# Patient Record
Sex: Female | Born: 1980 | Hispanic: No | Marital: Single | Smoking: Former smoker
Health system: Southern US, Community
[De-identification: ages and names within clinical notes are randomized; demographics above are authoritative.]

## PROBLEM LIST (undated history)

## (undated) DIAGNOSIS — O24419 Gestational diabetes mellitus in pregnancy, unspecified control: Secondary | ICD-10-CM

## (undated) DIAGNOSIS — L309 Dermatitis, unspecified: Secondary | ICD-10-CM

## (undated) HISTORY — DX: Gestational diabetes mellitus in pregnancy, unspecified control: O24.419

## (undated) HISTORY — PX: NO PAST SURGERIES: SHX2092

## (undated) HISTORY — DX: Dermatitis, unspecified: L30.9

---

## 2012-11-04 NOTE — L&D Delivery Note (Signed)
Delivery Note At 2:42 AM a viable female was delivered via Vaginal, Spontaneous Delivery (Presentation: Vertex, Right Occiput Anterior).  APGAR: 9, 9; weight pending.   Placenta status: Intact, Spontaneous.  Cord: 3 vessels with the following complications: None.  Cord pH: n/a  Anesthesia: None  Episiotomy: None Lacerations: None Suture Repair: n/a Est. Blood Loss (mL): 300  Mom to postpartum.  Baby to nursery-stable.  Napoleon Form 04/27/2013, 3:01 AM

## 2012-11-27 LAB — OB RESULTS CONSOLE ANTIBODY SCREEN: Antibody Screen: NEGATIVE

## 2012-11-27 LAB — OB RESULTS CONSOLE ABO/RH

## 2012-11-27 LAB — CYSTIC FIBROSIS DIAGNOSTIC STUDY: Interpretation-CFDNA:: NEGATIVE

## 2012-11-27 LAB — OB RESULTS CONSOLE VARICELLA ZOSTER ANTIBODY, IGG: Varicella: IMMUNE

## 2013-01-05 ENCOUNTER — Encounter: Payer: Self-pay | Admitting: *Deleted

## 2013-01-27 ENCOUNTER — Other Ambulatory Visit: Payer: Self-pay

## 2013-01-27 ENCOUNTER — Encounter: Payer: Self-pay | Admitting: Advanced Practice Midwife

## 2013-01-31 ENCOUNTER — Inpatient Hospital Stay (HOSPITAL_COMMUNITY): Admission: AD | Admit: 2013-01-31 | Payer: Self-pay | Source: Ambulatory Visit | Admitting: Obstetrics and Gynecology

## 2013-02-01 ENCOUNTER — Encounter: Payer: Self-pay | Admitting: Obstetrics & Gynecology

## 2013-02-01 ENCOUNTER — Other Ambulatory Visit: Payer: Self-pay

## 2013-02-15 ENCOUNTER — Other Ambulatory Visit: Payer: Medicaid Other

## 2013-02-15 ENCOUNTER — Other Ambulatory Visit: Payer: Self-pay

## 2013-02-15 ENCOUNTER — Ambulatory Visit (INDEPENDENT_AMBULATORY_CARE_PROVIDER_SITE_OTHER): Payer: Medicaid Other | Admitting: Obstetrics & Gynecology

## 2013-02-15 ENCOUNTER — Encounter: Payer: Self-pay | Admitting: Obstetrics & Gynecology

## 2013-02-15 VITALS — BP 120/58 | Wt 183.0 lb

## 2013-02-15 DIAGNOSIS — F1920 Other psychoactive substance dependence, uncomplicated: Secondary | ICD-10-CM

## 2013-02-15 DIAGNOSIS — O0930 Supervision of pregnancy with insufficient antenatal care, unspecified trimester: Secondary | ICD-10-CM

## 2013-02-15 DIAGNOSIS — O99320 Drug use complicating pregnancy, unspecified trimester: Secondary | ICD-10-CM

## 2013-02-15 DIAGNOSIS — O09219 Supervision of pregnancy with history of pre-term labor, unspecified trimester: Secondary | ICD-10-CM

## 2013-02-15 DIAGNOSIS — Z331 Pregnant state, incidental: Secondary | ICD-10-CM

## 2013-02-15 DIAGNOSIS — Z1389 Encounter for screening for other disorder: Secondary | ICD-10-CM

## 2013-02-15 DIAGNOSIS — O09299 Supervision of pregnancy with other poor reproductive or obstetric history, unspecified trimester: Secondary | ICD-10-CM

## 2013-02-15 DIAGNOSIS — Z3480 Encounter for supervision of other normal pregnancy, unspecified trimester: Secondary | ICD-10-CM | POA: Insufficient documentation

## 2013-02-15 DIAGNOSIS — Z8632 Personal history of gestational diabetes: Secondary | ICD-10-CM

## 2013-02-15 DIAGNOSIS — Z3483 Encounter for supervision of other normal pregnancy, third trimester: Secondary | ICD-10-CM

## 2013-02-15 LAB — POCT URINALYSIS DIPSTICK
Blood, UA: NEGATIVE
Glucose, UA: NEGATIVE
Leukocytes, UA: NEGATIVE
Nitrite, UA: NEGATIVE

## 2013-02-15 MED ORDER — FEXOFENADINE-PSEUDOEPHED ER 180-240 MG PO TB24: 1.0000 | ORAL_TABLET | Freq: Every day | ORAL | Status: DC

## 2013-02-15 NOTE — Progress Notes (Signed)
BP weight and urine results all reviewed and noted. Patient reports good fetal movement, denies any bleeding and no rupture of membranes symptoms or regular contractions. Patient is without complaints. All questions were answered. No complaints

## 2013-02-15 NOTE — Patient Instructions (Signed)
Pregnancy - Third Trimester  The third trimester of pregnancy (the last 3 months) is a period of the most rapid growth for you and your baby. The baby approaches a length of 20 inches and a weight of 6 to 10 pounds. The baby is adding on fat and getting ready for life outside your body. While inside, babies have periods of sleeping and waking, suck their thumbs, and hiccups. You can often feel small contractions of the uterus. This is false labor. It is also called Braxton-Hicks contractions. This is like a practice for labor. The usual problems in this stage of pregnancy include more difficulty breathing, swelling of the hands and feet from water retention, and having to urinate more often because of the uterus and baby pressing on your bladder.   PRENATAL EXAMS  · Blood work may continue to be done during prenatal exams. These tests are done to check on your health and the probable health of your baby. Blood work is used to follow your blood levels (hemoglobin). Anemia (low hemoglobin) is common during pregnancy. Iron and vitamins are given to help prevent this. You may also continue to be checked for diabetes. Some of the past blood tests may be done again.  · The size of the uterus is measured during each visit. This makes sure your baby is growing properly according to your pregnancy dates.  · Your blood pressure is checked every prenatal visit. This is to make sure you are not getting toxemia.  · Your urine is checked every prenatal visit for infection, diabetes and protein.  · Your weight is checked at each visit. This is done to make sure gains are happening at the suggested rate and that you and your baby are growing normally.  · Sometimes, an ultrasound is performed to confirm the position and the proper growth and development of the baby. This is a test done that bounces harmless sound waves off the baby so your caregiver can more accurately determine due dates.  · Discuss the type of pain medication and  anesthesia you will have during your labor and delivery.  · Discuss the possibility and anesthesia if a Cesarean Section might be necessary.  · Inform your caregiver if there is any mental or physical violence at home.  Sometimes, a specialized non-stress test, contraction stress test and biophysical profile are done to make sure the baby is not having a problem. Checking the amniotic fluid surrounding the baby is called an amniocentesis. The amniotic fluid is removed by sticking a needle into the belly (abdomen). This is sometimes done near the end of pregnancy if an early delivery is required. In this case, it is done to help make sure the baby's lungs are mature enough for the baby to live outside of the womb. If the lungs are not mature and it is unsafe to deliver the baby, an injection of cortisone medication is given to the mother 1 to 2 days before the delivery. This helps the baby's lungs mature and makes it safer to deliver the baby.  CHANGES OCCURING IN THE THIRD TRIMESTER OF PREGNANCY  Your body goes through many changes during pregnancy. They vary from person to person. Talk to your caregiver about changes you notice and are concerned about.  · During the last trimester, you have probably had an increase in your appetite. It is normal to have cravings for certain foods. This varies from person to person and pregnancy to pregnancy.  · You may begin to   get stretch marks on your hips, abdomen, and breasts. These are normal changes in the body during pregnancy. There are no exercises or medications to take which prevent this change.  · Constipation may be treated with a stool softener or adding bulk to your diet. Drinking lots of fluids, fiber in vegetables, fruits, and whole grains are helpful.  · Exercising is also helpful. If you have been very active up until your pregnancy, most of these activities can be continued during your pregnancy. If you have been less active, it is helpful to start an exercise  program such as walking. Consult your caregiver before starting exercise programs.  · Avoid all smoking, alcohol, un-prescribed drugs, herbs and "street drugs" during your pregnancy. These chemicals affect the formation and growth of the baby. Avoid chemicals throughout the pregnancy to ensure the delivery of a healthy infant.  · Backache, varicose veins and hemorrhoids may develop or get worse.  · You will tire more easily in the third trimester, which is normal.  · The baby's movements may be stronger and more often.  · You may become short of breath easily.  · Your belly button may stick out.  · A yellow discharge may leak from your breasts called colostrum.  · You may have a bloody mucus discharge. This usually occurs a few days to a week before labor begins.  HOME CARE INSTRUCTIONS   · Keep your caregiver's appointments. Follow your caregiver's instructions regarding medication use, exercise, and diet.  · During pregnancy, you are providing food for you and your baby. Continue to eat regular, well-balanced meals. Choose foods such as meat, fish, milk and other low fat dairy products, vegetables, fruits, and whole-grain breads and cereals. Your caregiver will tell you of the ideal weight gain.  · A physical sexual relationship may be continued throughout pregnancy if there are no other problems such as early (premature) leaking of amniotic fluid from the membranes, vaginal bleeding, or belly (abdominal) pain.  · Exercise regularly if there are no restrictions. Check with your caregiver if you are unsure of the safety of your exercises. Greater weight gain will occur in the last 2 trimesters of pregnancy. Exercising helps:  · Control your weight.  · Get you in shape for labor and delivery.  · You lose weight after you deliver.  · Rest a lot with legs elevated, or as needed for leg cramps or low back pain.  · Wear a good support or jogging bra for breast tenderness during pregnancy. This may help if worn during  sleep. Pads or tissues may be used in the bra if you are leaking colostrum.  · Do not use hot tubs, steam rooms, or saunas.  · Wear your seat belt when driving. This protects you and your baby if you are in an accident.  · Avoid raw meat, cat litter boxes and soil used by cats. These carry germs that can cause birth defects in the baby.  · It is easier to loose urine during pregnancy. Tightening up and strengthening the pelvic muscles will help with this problem. You can practice stopping your urination while you are going to the bathroom. These are the same muscles you need to strengthen. It is also the muscles you would use if you were trying to stop from passing gas. You can practice tightening these muscles up 10 times a set and repeating this about 3 times per day. Once you know what muscles to tighten up, do not perform these   exercises during urination. It is more likely to cause an infection by backing up the urine.  · Ask for help if you have financial, counseling or nutritional needs during pregnancy. Your caregiver will be able to offer counseling for these needs as well as refer you for other special needs.  · Make a list of emergency phone numbers and have them available.  · Plan on getting help from family or friends when you go home from the hospital.  · Make a trial run to the hospital.  · Take prenatal classes with the father to understand, practice and ask questions about the labor and delivery.  · Prepare the baby's room/nursery.  · Do not travel out of the city unless it is absolutely necessary and with the advice of your caregiver.  · Wear only low or no heal shoes to have better balance and prevent falling.  MEDICATIONS AND DRUG USE IN PREGNANCY  · Take prenatal vitamins as directed. The vitamin should contain 1 milligram of folic acid. Keep all vitamins out of reach of children. Only a couple vitamins or tablets containing iron may be fatal to a baby or young child when ingested.  · Avoid use  of all medications, including herbs, over-the-counter medications, not prescribed or suggested by your caregiver. Only take over-the-counter or prescription medicines for pain, discomfort, or fever as directed by your caregiver. Do not use aspirin, ibuprofen (Motrin®, Advil®, Nuprin®) or naproxen (Aleve®) unless OK'd by your caregiver.  · Let your caregiver also know about herbs you may be using.  · Alcohol is related to a number of birth defects. This includes fetal alcohol syndrome. All alcohol, in any form, should be avoided completely. Smoking will cause low birth rate and premature babies.  · Street/illegal drugs are very harmful to the baby. They are absolutely forbidden. A baby born to an addicted mother will be addicted at birth. The baby will go through the same withdrawal an adult does.  SEEK MEDICAL CARE IF:  You have any concerns or worries during your pregnancy. It is better to call with your questions if you feel they cannot wait, rather than worry about them.  DECISIONS ABOUT CIRCUMCISION  You may or may not know the sex of your baby. If you know your baby is a boy, it may be time to think about circumcision. Circumcision is the removal of the foreskin of the penis. This is the skin that covers the sensitive end of the penis. There is no proven medical need for this. Often this decision is made on what is popular at the time or based upon religious beliefs and social issues. You can discuss these issues with your caregiver or pediatrician.  SEEK IMMEDIATE MEDICAL CARE IF:   · An unexplained oral temperature above 102° F (38.9° C) develops, or as your caregiver suggests.  · You have leaking of fluid from the vagina (birth canal). If leaking membranes are suspected, take your temperature and tell your caregiver of this when you call.  · There is vaginal spotting, bleeding or passing clots. Tell your caregiver of the amount and how many pads are used.  · You develop a bad smelling vaginal discharge with  a change in the color from clear to white.  · You develop vomiting that lasts more than 24 hours.  · You develop chills or fever.  · You develop shortness of breath.  · You develop burning on urination.  · You loose more than 2 pounds of weight   or gain more than 2 pounds of weight or as suggested by your caregiver.  · You notice sudden swelling of your face, hands, and feet or legs.  · You develop belly (abdominal) pain. Round ligament discomfort is a common non-cancerous (benign) cause of abdominal pain in pregnancy. Your caregiver still must evaluate you.  · You develop a severe headache that does not go away.  · You develop visual problems, blurred or double vision.  · If you have not felt your baby move for more than 1 hour. If you think the baby is not moving as much as usual, eat something with sugar in it and lie down on your left side for an hour. The baby should move at least 4 to 5 times per hour. Call right away if your baby moves less than that.  · You fall, are in a car accident or any kind of trauma.  · There is mental or physical violence at home.  Document Released: 10/15/2001 Document Revised: 01/13/2012 Document Reviewed: 04/19/2009  [employer]® Patient Information ©2013 [employer].

## 2013-02-15 NOTE — Addendum Note (Signed)
Addended by: Lazaro Arms on: 02/15/2013 10:43 AM   Modules accepted: Orders

## 2013-02-16 LAB — CBC
MCH: 31.1 pg (ref 26.0–34.0)
Platelets: 248 10*3/uL (ref 150–400)
RBC: 3.31 MIL/uL — ABNORMAL LOW (ref 3.87–5.11)
WBC: 9.3 10*3/uL (ref 4.0–10.5)

## 2013-02-16 LAB — GLUCOSE TOLERANCE, 2 HOURS W/ 1HR: Glucose, Fasting: 76 mg/dL (ref 70–99)

## 2013-02-16 LAB — RPR

## 2013-02-16 LAB — ANTIBODY SCREEN: Antibody Screen: NEGATIVE

## 2013-03-11 ENCOUNTER — Encounter: Payer: Medicaid Other | Admitting: Advanced Practice Midwife

## 2013-03-17 ENCOUNTER — Other Ambulatory Visit: Payer: Self-pay | Admitting: Obstetrics & Gynecology

## 2013-03-17 ENCOUNTER — Encounter: Payer: Self-pay | Admitting: Advanced Practice Midwife

## 2013-03-17 ENCOUNTER — Ambulatory Visit (INDEPENDENT_AMBULATORY_CARE_PROVIDER_SITE_OTHER): Payer: Medicaid Other | Admitting: Obstetrics & Gynecology

## 2013-03-17 VITALS — BP 120/60 | Wt 189.0 lb

## 2013-03-17 DIAGNOSIS — Z1389 Encounter for screening for other disorder: Secondary | ICD-10-CM

## 2013-03-17 DIAGNOSIS — O09299 Supervision of pregnancy with other poor reproductive or obstetric history, unspecified trimester: Secondary | ICD-10-CM

## 2013-03-17 DIAGNOSIS — Z3483 Encounter for supervision of other normal pregnancy, third trimester: Secondary | ICD-10-CM

## 2013-03-17 DIAGNOSIS — O99320 Drug use complicating pregnancy, unspecified trimester: Secondary | ICD-10-CM

## 2013-03-17 DIAGNOSIS — O09219 Supervision of pregnancy with history of pre-term labor, unspecified trimester: Secondary | ICD-10-CM

## 2013-03-17 DIAGNOSIS — Z331 Pregnant state, incidental: Secondary | ICD-10-CM

## 2013-03-17 LAB — POCT URINALYSIS DIPSTICK
Glucose, UA: NEGATIVE
Nitrite, UA: NEGATIVE

## 2013-03-17 MED ORDER — ACYCLOVIR 400 MG PO TABS: 400.0000 mg | ORAL_TABLET | Freq: Three times a day (TID) | ORAL | Status: DC

## 2013-03-17 NOTE — Progress Notes (Signed)
Some pressure and mucus. 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 Routine questions about pregnancy answered.  F/U in 2 weeks for LROB and GBS (unlikely to go past 41.2 weeks, so culture will be valid).

## 2013-03-17 NOTE — Progress Notes (Signed)
Having a little pressure. Has a mucus discharge.

## 2013-03-17 NOTE — Assessment & Plan Note (Addendum)
Clinic:[facility]  Genetic Screen           Quad Screen/MSAFP:normalk  Anatomic [place] normal  Glucose Screen 76/142/99  GBS   Feeding Preference br/bottle  Contraception ?nexplanon vs Depo  Circumcision At [facility]

## 2013-04-05 ENCOUNTER — Encounter: Payer: Medicaid Other | Admitting: Women's Health

## 2013-04-05 ENCOUNTER — Other Ambulatory Visit: Payer: Medicaid Other

## 2013-04-20 ENCOUNTER — Encounter: Payer: Self-pay | Admitting: Women's Health

## 2013-04-20 ENCOUNTER — Other Ambulatory Visit: Payer: Medicaid Other

## 2013-04-20 ENCOUNTER — Ambulatory Visit (INDEPENDENT_AMBULATORY_CARE_PROVIDER_SITE_OTHER): Payer: Medicaid Other | Admitting: Women's Health

## 2013-04-20 VITALS — BP 162/60 | Wt 193.2 lb

## 2013-04-20 DIAGNOSIS — O99340 Other mental disorders complicating pregnancy, unspecified trimester: Secondary | ICD-10-CM

## 2013-04-20 DIAGNOSIS — R768 Other specified abnormal immunological findings in serum: Secondary | ICD-10-CM | POA: Insufficient documentation

## 2013-04-20 DIAGNOSIS — O0933 Supervision of pregnancy with insufficient antenatal care, third trimester: Secondary | ICD-10-CM

## 2013-04-20 DIAGNOSIS — Z3483 Encounter for supervision of other normal pregnancy, third trimester: Secondary | ICD-10-CM

## 2013-04-20 DIAGNOSIS — O09219 Supervision of pregnancy with history of pre-term labor, unspecified trimester: Secondary | ICD-10-CM

## 2013-04-20 DIAGNOSIS — O99323 Drug use complicating pregnancy, third trimester: Secondary | ICD-10-CM

## 2013-04-20 DIAGNOSIS — Z1389 Encounter for screening for other disorder: Secondary | ICD-10-CM

## 2013-04-20 DIAGNOSIS — Z331 Pregnant state, incidental: Secondary | ICD-10-CM

## 2013-04-20 DIAGNOSIS — O99320 Drug use complicating pregnancy, unspecified trimester: Secondary | ICD-10-CM | POA: Insufficient documentation

## 2013-04-20 DIAGNOSIS — O0930 Supervision of pregnancy with insufficient antenatal care, unspecified trimester: Secondary | ICD-10-CM | POA: Insufficient documentation

## 2013-04-20 NOTE — Progress Notes (Signed)
BP recheck 148/96.  Last appt was 34.2wks- states has been in [place] spending time w/ brother. Reports good fm. Denies regular uc's, lof, vb, urinary frequency, urgency, hesitancy, or dysuria.  DTRs 2+, no edema, no clonus. Offered membrane sweeping, reviewed r/b, pt decided to proceed so membranes swept. Reviewed labor s/s, pre-e s/s, and fetal kick counts.  All questions answered. F/U on Fri for bp recheck and visit.

## 2013-04-20 NOTE — Patient Instructions (Addendum)
Braxton Hicks Contractions Pregnancy is commonly associated with contractions of the uterus throughout the pregnancy. Towards the end of pregnancy (32 to 34 weeks), these contractions [facility]) can develop more often and may become more forceful. This is not true labor because these contractions do not result in opening (dilatation) and thinning of the cervix. They are sometimes difficult to tell apart from true labor because these contractions can be forceful and people have different pain tolerances. You should not feel embarrassed if you go to the hospital with false labor. Sometimes, the only way to tell if you are in true labor is for your caregiver to follow the changes in the cervix. How to tell the difference between true and false labor:  False labor.  The contractions of false labor are usually shorter, irregular and not as hard as those of true labor.  They are often felt in the front of the lower abdomen and in the groin.  They may leave with walking around or changing positions while lying down.  They get weaker and are shorter lasting as time goes on.  These contractions are usually irregular.  They do not usually become progressively stronger, regular and closer together as with true labor.  True labor.  Contractions in true labor last 30 to 70 seconds, become very regular, usually become more intense, and increase in frequency.  They do not go away with walking.  The discomfort is usually felt in the top of the uterus and spreads to the lower abdomen and low back.  True labor can be determined by your caregiver with an exam. This will show that the cervix is dilating and getting thinner. If there are no prenatal problems or other health problems associated with the pregnancy, it is completely safe to be sent home with false labor and await the onset of true labor. HOME CARE INSTRUCTIONS   Keep up with your usual exercises and instructions.  Take medications as  directed.  Keep your regular prenatal appointment.  Eat and drink lightly if you think you are going into labor.  If BH contractions are making you uncomfortable:  Change your activity position from lying down or resting to walking/walking to resting.  Sit and rest in a tub of warm water.  Drink 2 to 3 glasses of water. Dehydration may cause B-H contractions.  Do slow and deep breathing several times an hour. SEEK IMMEDIATE MEDICAL CARE IF:   Your contractions continue to become stronger, more regular, and closer together.  You have a gushing, burst or leaking of fluid from the vagina.  An oral temperature above 102 F (38.9 C) develops.  You have passage of blood-tinged mucus.  You develop vaginal bleeding.  You develop continuous belly (abdominal) pain.  You have low back pain that you never had before.  You feel the baby's head pushing down causing pelvic pressure.  The baby is not moving as much as it used to. Document Released: 10/21/2005 Document Revised: 01/13/2012 Document Reviewed: 04/14/2009 [facility] Patient Information 2014 [place], [place].  Preeclampsia and Eclampsia Preeclampsia is a condition of high blood pressure during pregnancy. It can happen at 20 weeks or later in pregnancy. If high blood pressure occurs in the second half of pregnancy with no other symptoms, it is called gestational hypertension and goes away after the baby is born. If any of the symptoms listed below develop with gestational hypertension, it is then called preeclampsia. Eclampsia (convulsions) may follow preeclampsia. This is one of the reasons for  regular prenatal checkups. Early diagnosis and treatment are very important to prevent eclampsia. CAUSES  There is no known cause of preeclampsia/eclampsia in pregnancy. There are several known conditions that may put the pregnant woman at risk, such as:  The first pregnancy.  Having preeclampsia in a past pregnancy.  Having lasting  (chronic) high blood pressure.  Having multiples (twins, triplets).  Being age 39 or older.  African American ethnic background.  Having kidney disease or diabetes.  Medical conditions such as lupus or blood diseases.  Being overweight (obese). SYMPTOMS   High blood pressure.  Headaches.  Sudden weight gain.  Swelling of hands, face, legs, and feet.  Protein in the urine.  Feeling sick to your stomach (nauseous) and throwing up (vomiting).  Vision problems (blurred or double vision).  Numbness in the face, arms, legs, and feet.  Dizziness.  Slurred speech.  Preeclampsia can cause growth retardation in the fetus.  Separation (abruption) of the placenta.  Not enough fluid in the amniotic sac (oligohydramnios).  Sensitivity to bright lights.  Belly (abdominal) pain. DIAGNOSIS  If protein is found in the urine in the second half of pregnancy, this is considered preeclampsia. Other symptoms mentioned above may also be present. TREATMENT  It is necessary to treat this.  Your caregiver may prescribe bed rest early in this condition. Plenty of rest and salt restriction may be all that is needed.  Medicines may be necessary to lower blood pressure if the condition does not respond to more conservative measures.  In more severe cases, hospitalization may be needed:  For treatment of blood pressure.  To control fluid retention.  To monitor the baby to see if the condition is causing harm to the baby.  Hospitalization is the best way to treat the first sign of preeclampsia. This is so the mother and baby can be watched closely and blood tests can be done effectively and correctly.  If the condition becomes severe, it may be necessary to induce labor or to remove the infant by surgical means (cesarean section). The best cure for preeclampsia/eclampsia is to deliver the baby. Preeclampsia and eclampsia involve risks to mother and infant. Your caregiver will discuss  these risks with you. Together, you can work out the best possible approach to your problems. Make sure you keep your prenatal visits as scheduled. Not keeping appointments could result in a chronic or permanent injury, pain, disability to you, and death or injury to you or your unborn baby. If there is any problem keeping the appointment, you must call to reschedule. HOME CARE INSTRUCTIONS   Keep your prenatal appointments and tests as scheduled.  Tell your caregiver if you have any of the above risk factors.  Get plenty of rest and sleep.  Eat a balanced diet that is low in salt, and do not add salt to your food.  Avoid stressful situations.  Only take over-the-counter and prescriptions medicines for pain, discomfort, or fever as directed by your caregiver. SEEK IMMEDIATE MEDICAL CARE IF:   You develop severe swelling anywhere in the body. This usually occurs in the legs.  You gain 5 lb/2.3 kg or more in a week.  You develop a severe headache, dizziness, problems with your vision, or confusion.  You have abdominal pain, nausea, or vomiting.  You have a seizure.  You have trouble moving any part of your body, or you develop numbness or problems speaking.  You have bruising or abnormal bleeding from anywhere in the body.  You  develop a stiff neck.  You pass out. MAKE SURE YOU:   Understand these instructions.  Will watch your condition.  Will get help right away if you are not doing well or get worse. Document Released: 10/18/2000 Document Revised: 01/13/2012 Document Reviewed: 06/03/2008 [facility] Patient Information 2014 [place], [place].

## 2013-04-21 ENCOUNTER — Encounter (HOSPITAL_COMMUNITY): Payer: Self-pay | Admitting: *Deleted

## 2013-04-21 ENCOUNTER — Inpatient Hospital Stay (HOSPITAL_COMMUNITY)
Admission: AD | Admit: 2013-04-21 | Discharge: 2013-04-21 | Disposition: A | Discharge status: Home / Self Care | Payer: Medicaid Other | Source: Ambulatory Visit | Attending: Family Medicine | Admitting: Family Medicine

## 2013-04-21 DIAGNOSIS — Z3483 Encounter for supervision of other normal pregnancy, third trimester: Secondary | ICD-10-CM

## 2013-04-21 DIAGNOSIS — O0933 Supervision of pregnancy with insufficient antenatal care, third trimester: Secondary | ICD-10-CM

## 2013-04-21 DIAGNOSIS — O99323 Drug use complicating pregnancy, third trimester: Secondary | ICD-10-CM

## 2013-04-21 DIAGNOSIS — O479 False labor, unspecified: Secondary | ICD-10-CM

## 2013-04-21 DIAGNOSIS — O09893 Supervision of other high risk pregnancies, third trimester: Secondary | ICD-10-CM

## 2013-04-21 DIAGNOSIS — O4700 False labor before 37 completed weeks of gestation, unspecified trimester: Secondary | ICD-10-CM

## 2013-04-21 LAB — DRUG SCREEN, URINE, NO CONFIRMATION
Benzodiazepines.: NEGATIVE
Creatinine,U: 232.2 mg/dL
Phencyclidine (PCP): NEGATIVE
Propoxyphene: NEGATIVE

## 2013-04-21 LAB — GC/CHLAMYDIA PROBE AMP
CT Probe RNA: NEGATIVE
GC Probe RNA: NEGATIVE

## 2013-04-21 LAB — OXYCODONE SCREEN, UA, RFLX CONFIRM: Oxycodone Screen, Ur: NEGATIVE ng/mL

## 2013-04-21 NOTE — MAU Provider Note (Signed)
  History     CSN: [id number]  Arrival date and time: 04/21/13 8469  Chief Complaint  Patient presents with  . Labor Eval   HPI  32 y.o. [id number] [redacted]w[redacted]d presents with onset of contractions in the early morning hours.  Patient denies any vaginal bleeding, vaginal discharge, or fluid leakage.  Patient reports good fetal movement.  Patient is accompanied by her husband to the ED.  Patient reports dilation to 4 cm at last office visit.    Past Medical History  Diagnosis Date  . Gestational diabetes     1st pregnancy    Past Surgical History  Procedure Laterality Date  . No past surgeries      Family History  Problem Relation Age of Onset  . Hypertension Other   . Stroke Other   . Cancer Other     prostate, colon, cervical  . Heart attack Other   . Coronary artery disease Other     History  Substance Use Topics  . Smoking status: Former Smoker    Types: Cigarettes  . Smokeless tobacco: Not on file  . Alcohol Use: No    Allergies: No Known Allergies  Prescriptions prior to admission  Medication Sig Dispense Refill  . prenatal vitamin w/FE, FA (PRENATAL 1 + 1) 27-1 MG TABS Take 1 tablet by mouth daily at 12 noon.      Marland Kitchen acyclovir (ZOVIRAX) 400 MG tablet Take 1 tablet (400 mg total) by mouth 3 (three) times daily.  90 tablet  3    Review of Systems  Constitutional: Negative.   Respiratory: Negative.   Cardiovascular: Negative.   Gastrointestinal: Negative.   Genitourinary:       Contractions  Skin: Negative.    Physical Exam   Blood pressure 142/85, pulse 75, temperature 99.2 F (37.3 C), temperature source Oral, resp. rate 20, height 5\' 6"  (1.676 m), weight 87.091 kg (192 lb), last menstrual period 07/20/2012.  Physical Exam  Constitutional: She appears well-developed and well-nourished.  HENT:  Head: Normocephalic and atraumatic.  Cardiovascular: Normal rate, regular rhythm and normal heart sounds.   Respiratory: Effort normal and breath sounds normal.   Genitourinary:  FHT - 125 bpm, moderate variability, + acels, no decels present  UC - None  Cervix - Exam by nursing. 4/50/-3 (posterior position)    MAU Course  Procedures  Patient placed on monitors with reactive FHT and no evidence of maternal contractions.    Assessment and Plan   32 y.o. [id number] [redacted]w[redacted]d presents with onset of contractions in the early morning hours.  Cervical exam unchanged from most recent prenatal visit.  No evidence of contractions on monitor with reactive FHT.  Will discharge home with labor precautions.  Holly Stegall 04/21/2013, 6:01 AM   I have participated in the care of this patient and I agree with the above. Cam Hai 9:42 AM 04/21/2013

## 2013-04-21 NOTE — MAU Note (Signed)
contractions 

## 2013-04-22 NOTE — MAU Provider Note (Signed)
Chart reviewed and agree with management and plan.  

## 2013-04-23 ENCOUNTER — Encounter: Payer: Medicaid Other | Admitting: Obstetrics & Gynecology

## 2013-04-24 ENCOUNTER — Encounter: Payer: Self-pay | Admitting: Women's Health

## 2013-04-26 ENCOUNTER — Encounter (HOSPITAL_COMMUNITY): Payer: Self-pay | Admitting: *Deleted

## 2013-04-26 ENCOUNTER — Inpatient Hospital Stay (HOSPITAL_COMMUNITY)
Admission: AD | Admit: 2013-04-26 | Discharge: 2013-04-28 | DRG: 775 | Disposition: A | Discharge status: Home / Self Care | Payer: Medicaid Other | Source: Ambulatory Visit | Attending: Family Medicine | Admitting: Family Medicine

## 2013-04-26 ENCOUNTER — Encounter: Payer: Self-pay | Admitting: Obstetrics & Gynecology

## 2013-04-26 ENCOUNTER — Ambulatory Visit (INDEPENDENT_AMBULATORY_CARE_PROVIDER_SITE_OTHER): Payer: Medicaid Other | Admitting: Obstetrics & Gynecology

## 2013-04-26 VITALS — BP 160/90 | Wt 192.0 lb

## 2013-04-26 DIAGNOSIS — O99323 Drug use complicating pregnancy, third trimester: Secondary | ICD-10-CM

## 2013-04-26 DIAGNOSIS — Z22330 Carrier of Group B streptococcus: Secondary | ICD-10-CM

## 2013-04-26 DIAGNOSIS — Z331 Pregnant state, incidental: Secondary | ICD-10-CM

## 2013-04-26 DIAGNOSIS — O0933 Supervision of pregnancy with insufficient antenatal care, third trimester: Secondary | ICD-10-CM

## 2013-04-26 DIAGNOSIS — Z1389 Encounter for screening for other disorder: Secondary | ICD-10-CM

## 2013-04-26 DIAGNOSIS — O0930 Supervision of pregnancy with insufficient antenatal care, unspecified trimester: Secondary | ICD-10-CM

## 2013-04-26 DIAGNOSIS — O99892 Other specified diseases and conditions complicating childbirth: Secondary | ICD-10-CM | POA: Diagnosis present

## 2013-04-26 DIAGNOSIS — O139 Gestational [pregnancy-induced] hypertension without significant proteinuria, unspecified trimester: Principal | ICD-10-CM | POA: Diagnosis present

## 2013-04-26 DIAGNOSIS — O09219 Supervision of pregnancy with history of pre-term labor, unspecified trimester: Secondary | ICD-10-CM

## 2013-04-26 DIAGNOSIS — O09893 Supervision of other high risk pregnancies, third trimester: Secondary | ICD-10-CM

## 2013-04-26 DIAGNOSIS — Z3483 Encounter for supervision of other normal pregnancy, third trimester: Secondary | ICD-10-CM

## 2013-04-26 DIAGNOSIS — O99340 Other mental disorders complicating pregnancy, unspecified trimester: Secondary | ICD-10-CM

## 2013-04-26 LAB — CBC
Hemoglobin: 9.4 g/dL — ABNORMAL LOW (ref 12.0–15.0)
MCH: 28.7 pg (ref 26.0–34.0)
MCHC: 33 g/dL (ref 30.0–36.0)
Platelets: 205 10*3/uL (ref 150–400)
RBC: 3.27 MIL/uL — ABNORMAL LOW (ref 3.87–5.11)

## 2013-04-26 LAB — POCT URINALYSIS DIPSTICK
Blood, UA: NEGATIVE
Nitrite, UA: NEGATIVE

## 2013-04-26 LAB — COMPREHENSIVE METABOLIC PANEL
ALT: 11 U/L (ref 0–35)
Alkaline Phosphatase: 140 U/L — ABNORMAL HIGH (ref 39–117)
BUN: 8 mg/dL (ref 6–23)
CO2: 24 mEq/L (ref 19–32)
GFR calc Af Amer: >90 mL/min (ref >90)
GFR calc non Af Amer: >90 mL/min (ref >90)
Glucose, Bld: 90 mg/dL (ref 70–99)
Potassium: 3.9 mEq/L (ref 3.5–5.1)
Sodium: 134 mEq/L — ABNORMAL LOW (ref 135–145)
Total Bilirubin: 0.3 mg/dL (ref 0.3–1.2)
Total Protein: 6 g/dL (ref 6.0–8.3)

## 2013-04-26 LAB — TYPE AND SCREEN
ABO/RH(D): O POS
Antibody Screen: NEGATIVE

## 2013-04-26 LAB — PROTEIN / CREATININE RATIO, URINE: Creatinine, Urine: 32.74 mg/dL

## 2013-04-26 MED ORDER — LACTATED RINGERS IV SOLN
INTRAVENOUS | Status: DC
  Administered 2013-04-26: 21:00:00 via INTRAVENOUS

## 2013-04-26 MED ORDER — NALBUPHINE SYRINGE 5 MG/0.5 ML
10.0000 mg | INJECTION | INTRAMUSCULAR | Status: DC | PRN
  Filled 2013-04-26: qty 1

## 2013-04-26 MED ORDER — TERBUTALINE SULFATE 1 MG/ML IJ SOLN: 0.2500 mg | Freq: Once | INTRAMUSCULAR | Status: AC | PRN

## 2013-04-26 MED ORDER — CITRIC ACID-SODIUM CITRATE 334-500 MG/5ML PO SOLN: 30.0000 mL | ORAL | Status: DC | PRN

## 2013-04-26 MED ORDER — ACYCLOVIR 200 MG PO CAPS
400.0000 mg | ORAL_CAPSULE | Freq: Three times a day (TID) | ORAL | Status: DC
  Administered 2013-04-26: 400 mg via ORAL
  Filled 2013-04-26 (×2): qty 2

## 2013-04-26 MED ORDER — OXYTOCIN 40 UNITS IN LACTATED RINGERS INFUSION - SIMPLE MED
62.5000 mL/h | INTRAVENOUS | Status: DC
  Administered 2013-04-27: 999 mL/h via INTRAVENOUS

## 2013-04-26 MED ORDER — OXYTOCIN BOLUS FROM INFUSION: 500.0000 mL | INTRAVENOUS | Status: DC

## 2013-04-26 MED ORDER — LACTATED RINGERS IV SOLN: 500.0000 mL | INTRAVENOUS | Status: DC | PRN

## 2013-04-26 MED ORDER — PENICILLIN G POTASSIUM 5000000 UNITS IJ SOLR
2.5000 10*6.[IU] | INTRAVENOUS | Status: DC
  Administered 2013-04-27: 2.5 10*6.[IU] via INTRAVENOUS
  Filled 2013-04-26 (×5): qty 2.5

## 2013-04-26 MED ORDER — PENICILLIN G POTASSIUM 5000000 UNITS IJ SOLR
5.0000 10*6.[IU] | Freq: Once | INTRAMUSCULAR | Status: AC
  Administered 2013-04-26: 5 10*6.[IU] via INTRAVENOUS
  Filled 2013-04-26: qty 5

## 2013-04-26 MED ORDER — OXYCODONE-ACETAMINOPHEN 5-325 MG PO TABS
1.0000 | ORAL_TABLET | ORAL | Status: DC | PRN
  Administered 2013-04-27: 1 via ORAL
  Filled 2013-04-26: qty 1

## 2013-04-26 MED ORDER — ACETAMINOPHEN 325 MG PO TABS: 650.0000 mg | ORAL_TABLET | ORAL | Status: DC | PRN

## 2013-04-26 MED ORDER — OXYTOCIN 40 UNITS IN LACTATED RINGERS INFUSION - SIMPLE MED
1.0000 m[IU]/min | INTRAVENOUS | Status: DC
  Administered 2013-04-26: 2 m[IU]/min via INTRAVENOUS
  Filled 2013-04-26: qty 1000

## 2013-04-26 MED ORDER — LIDOCAINE HCL (PF) 1 % IJ SOLN
30.0000 mL | INTRAMUSCULAR | Status: DC | PRN
  Filled 2013-04-26 (×2): qty 30

## 2013-04-26 MED ORDER — ONDANSETRON HCL 4 MG/2ML IJ SOLN: 4.0000 mg | Freq: Four times a day (QID) | INTRAMUSCULAR | Status: DC | PRN

## 2013-04-26 MED ORDER — ACYCLOVIR 400 MG PO TABS
400.0000 mg | ORAL_TABLET | Freq: Three times a day (TID) | ORAL | Status: DC
  Filled 2013-04-26 (×2): qty 1

## 2013-04-26 MED ORDER — IBUPROFEN 600 MG PO TABS: 600.0000 mg | ORAL_TABLET | Freq: Four times a day (QID) | ORAL | Status: DC | PRN

## 2013-04-26 NOTE — H&P (Signed)
Amanda Mccoy is a 32 y.o. female presenting for IOL for Gestational Hypertension.   Maternal Medical History:  Fetal activity: Perceived fetal activity is normal.   Last perceived fetal movement was within the past hour.    Prenatal complications: PIH.   Prenatal Complications - Diabetes: none.     32 y.o. [id number] at [redacted]w[redacted]d with 5 prior SVDs receiving care at [facility]. Had BP 162/60 on 6/17 and 160/90 today in clinic. No headache, RUQ pain or vision changes, no proteinuria previously. Normal genetic screen and 2 hour GTT. No prior pregnancy problems, no hx HTN or preeclampsia, no diabetes. GBS positive.   Having occasional mild contractions, no bleeding or loss of fluid. Fetal movement normal.  OB History   Grav Para Term Preterm Abortions TAB SAB Ect Mult Living   6 5 3 2      4      Past Medical History  Diagnosis Date  . Gestational diabetes     1st pregnancy   Past Surgical History  Procedure Laterality Date  . No past surgeries     Family History: family history includes Cancer in her other; Coronary artery disease in her other; Heart attack in her other; Hypertension in her other; and Stroke in her other. Social History:  reports that she has quit smoking. Her smoking use included Cigarettes. She smoked 0.00 packs per day. She does not have any smokeless tobacco history on file. She reports that she uses illicit drugs (Marijuana). She reports that she does not drink alcohol.   Prenatal Transfer Tool  Maternal Diabetes: No Genetic Screening: Normal Maternal Ultrasounds/Referrals: Normal Fetal Ultrasounds or other Referrals:  None Maternal Substance Abuse:  Yes:  Type: Marijuana Significant Maternal Medications:  None Significant Maternal Lab Results:  Lab values include: Group B Strep positive Other Comments:  None  ROS   See HPI.   Dilation: 4 Effacement (%): 70 Station: -2 Exam by:: L Lamon RN Blood pressure 141/80, pulse 76, temperature 98 F (36.7 C),  resp. rate 20, height 5\' 6"  (1.676 m), weight 87.091 kg (192 lb), last menstrual period 07/20/2012. Maternal Exam:  Uterine Assessment: Contraction strength is mild.  Contraction frequency is irregular.   Abdomen: Fetal presentation: vertex  Pelvis: adequate for delivery.   Cervix: Cervix evaluated by digital exam.     Fetal Exam Fetal Monitor Review: Mode: ultrasound.   Baseline rate: 135.  Variability: moderate (6-25 bpm).   Pattern: accelerations present and no decelerations.    Fetal State Assessment: Category I - tracings are normal.     Physical Exam  Constitutional: She is oriented to person, place, and time. She appears well-developed and well-nourished. No distress.  HENT:  Head: Normocephalic and atraumatic.  Eyes: Conjunctivae and EOM are normal.  Neck: Normal range of motion. Neck supple.  Cardiovascular: Normal rate.   Respiratory: Effort normal. No respiratory distress.  GI: Soft. There is no tenderness. There is no rebound and no guarding.  Musculoskeletal: Normal range of motion. She exhibits no edema and no tenderness.  Neurological: She is alert and oriented to person, place, and time.  Skin: Skin is warm and dry.  Psychiatric: She has a normal mood and affect.    Dilation: 4 Effacement (%): 70 Cervical Position: Middle Station: -2 Presentation: Vertex Exam by:: L Lamon RN   Prenatal labs: ABO, Rh: O/Positive/-- (01/24 0000) Antibody: NEG (04/14 1133) Rubella: Immune (01/24 0000) RPR: NON REAC (04/14 1133)  HBsAg: Negative (01/24 0000)  HIV: NON REACTIVE (04/14  1133)  GBS: POSITIVE (06/17 1554)   Assessment/Plan: 32 y.o. [id number] at [redacted]w[redacted]d with Gestational HTN - Check PIH labs and monitor BP, treat for BP > 160/110. Mag for severe features. - GBS positive - PCN - FHTs reactive - Cervix favorable - start pitocin - Anticipate SVD  Napoleon Form 04/26/2013, 9:37 PM

## 2013-04-26 NOTE — Progress Notes (Signed)
bp remains elevated, work up for pre eclamapsia has been negative, will admit for induction of labor for gestational hyperension BP weight and urine results all reviewed and noted. Patient reports good fetal movement, denies any bleeding and no rupture of membranes symptoms or regular contractions. Patient is without complaints. All questions were answered.

## 2013-04-27 ENCOUNTER — Encounter (HOSPITAL_COMMUNITY): Payer: Self-pay | Admitting: *Deleted

## 2013-04-27 DIAGNOSIS — Z22330 Carrier of Group B streptococcus: Secondary | ICD-10-CM

## 2013-04-27 DIAGNOSIS — O9989 Other specified diseases and conditions complicating pregnancy, childbirth and the puerperium: Secondary | ICD-10-CM

## 2013-04-27 DIAGNOSIS — O139 Gestational [pregnancy-induced] hypertension without significant proteinuria, unspecified trimester: Secondary | ICD-10-CM

## 2013-04-27 LAB — ABO/RH: ABO/RH(D): O POS

## 2013-04-27 MED ORDER — BISACODYL 10 MG RE SUPP: 10.0000 mg | Freq: Every day | RECTAL | Status: DC | PRN

## 2013-04-27 MED ORDER — ONDANSETRON HCL 4 MG/2ML IJ SOLN: 4.0000 mg | INTRAMUSCULAR | Status: DC | PRN

## 2013-04-27 MED ORDER — DIPHENHYDRAMINE HCL 25 MG PO CAPS: 25.0000 mg | ORAL_CAPSULE | Freq: Four times a day (QID) | ORAL | Status: DC | PRN

## 2013-04-27 MED ORDER — ONDANSETRON HCL 4 MG PO TABS: 4.0000 mg | ORAL_TABLET | ORAL | Status: DC | PRN

## 2013-04-27 MED ORDER — WITCH HAZEL-GLYCERIN EX PADS: 1.0000 "application " | MEDICATED_PAD | CUTANEOUS | Status: DC | PRN

## 2013-04-27 MED ORDER — SENNOSIDES-DOCUSATE SODIUM 8.6-50 MG PO TABS
2.0000 | ORAL_TABLET | Freq: Every day | ORAL | Status: DC
  Administered 2013-04-27: 2 via ORAL

## 2013-04-27 MED ORDER — OXYTOCIN 40 UNITS IN LACTATED RINGERS INFUSION - SIMPLE MED: 62.5000 mL/h | INTRAVENOUS | Status: DC | PRN

## 2013-04-27 MED ORDER — FERROUS SULFATE 325 (65 FE) MG PO TABS
325.0000 mg | ORAL_TABLET | Freq: Two times a day (BID) | ORAL | Status: DC
  Administered 2013-04-28: 325 mg via ORAL
  Filled 2013-04-27 (×2): qty 1

## 2013-04-27 MED ORDER — SIMETHICONE 80 MG PO CHEW: 80.0000 mg | CHEWABLE_TABLET | ORAL | Status: DC | PRN

## 2013-04-27 MED ORDER — OXYCODONE-ACETAMINOPHEN 5-325 MG PO TABS
1.0000 | ORAL_TABLET | ORAL | Status: DC | PRN
  Administered 2013-04-27: 1 via ORAL
  Filled 2013-04-27: qty 1

## 2013-04-27 MED ORDER — IBUPROFEN 600 MG PO TABS
600.0000 mg | ORAL_TABLET | Freq: Four times a day (QID) | ORAL | Status: DC
  Administered 2013-04-27 – 2013-04-28 (×6): 600 mg via ORAL
  Filled 2013-04-27 (×6): qty 1

## 2013-04-27 MED ORDER — FLEET ENEMA 7-19 GM/118ML RE ENEM: 1.0000 | ENEMA | Freq: Every day | RECTAL | Status: DC | PRN

## 2013-04-27 MED ORDER — LANOLIN HYDROUS EX OINT: TOPICAL_OINTMENT | CUTANEOUS | Status: DC | PRN

## 2013-04-27 MED ORDER — MISOPROSTOL 200 MCG PO TABS
800.0000 ug | ORAL_TABLET | Freq: Once | ORAL | Status: AC
  Administered 2013-04-27: 800 ug via RECTAL

## 2013-04-27 MED ORDER — BENZOCAINE-MENTHOL 20-0.5 % EX AERO
1.0000 "application " | INHALATION_SPRAY | CUTANEOUS | Status: DC | PRN
  Filled 2013-04-27: qty 56

## 2013-04-27 MED ORDER — PRENATAL MULTIVITAMIN CH
1.0000 | ORAL_TABLET | Freq: Every day | ORAL | Status: DC
  Administered 2013-04-28: 1 via ORAL
  Filled 2013-04-27: qty 1

## 2013-04-27 MED ORDER — MEASLES, MUMPS & RUBELLA VAC ~~LOC~~ INJ
0.5000 mL | INJECTION | Freq: Once | SUBCUTANEOUS | Status: DC
  Filled 2013-04-27: qty 0.5

## 2013-04-27 MED ORDER — DIBUCAINE 1 % RE OINT: 1.0000 "application " | TOPICAL_OINTMENT | RECTAL | Status: DC | PRN

## 2013-04-27 MED ORDER — ZOLPIDEM TARTRATE 5 MG PO TABS: 5.0000 mg | ORAL_TABLET | Freq: Every evening | ORAL | Status: DC | PRN

## 2013-04-27 MED ORDER — MISOPROSTOL 200 MCG PO TABS
ORAL_TABLET | ORAL | Status: AC
  Filled 2013-04-27: qty 4

## 2013-04-27 MED ORDER — TETANUS-DIPHTH-ACELL PERTUSSIS 5-2.5-18.5 LF-MCG/0.5 IM SUSP
0.5000 mL | Freq: Once | INTRAMUSCULAR | Status: AC
  Administered 2013-04-27: 0.5 mL via INTRAMUSCULAR
  Filled 2013-04-27: qty 0.5

## 2013-04-27 NOTE — H&P (Signed)
Chart reviewed and agree with management and plan.  

## 2013-04-27 NOTE — Clinical Social Work Maternal (Signed)
Clinical Social Work Department PSYCHOSOCIAL ASSESSMENT - MATERNAL/CHILD 04/27/2013  Patient:  Amanda Mccoy, Amanda Mccoy  Account Number:  [id number]  Admit Date:  04/26/2013  Marjo Bicker Name:   Luellen Pucker    Clinical Social Worker:  Nobie Putnam, LCSW   Date/Time:  04/27/2013 02:05 PM  Date Referred:  04/27/2013   Referral source  CN     Referred reason  Substance Abuse  [facility]   Other referral source:    I:  FAMILY / HOME ENVIRONMENT Child's legal guardian:  PARENT  Guardian - Name Guardian - Age Guardian - Address  Ivon Schweer 32 [address] [place], [place] [address]  Lissa Morales 40 (same as above)   Other household support members/support persons Name Relationship DOB   DAUGHTER 2004   DAUGHTER 2006   SON 2008   DAUGHTER 2009   SON 2013   Other support:   Aunt & cousin    II  PSYCHOSOCIAL DATA Information Source:  Patient Interview  Event organiser Employment:   Surveyor, quantity resources:  [employer] If Medicaid - County:  [employer] Other  Psychologist, sport and exercise Housing  [employer] / Grade:   Maternity Care Coordinator / Child Services Coordination / Early Interventions:  Cultural issues impacting care:    III  STRENGTHS Strengths  Adequate Resources  Home prepared for Child (including basic supplies)  Supportive family/friends   Strength comment:    IV  RISK FACTORS AND CURRENT PROBLEMS Current Problem:  YES   Risk Factor & Current Problem Patient Issue Family Issue Risk Factor / Current Problem Comment  Substance Abuse Y N Hx MJ use  Other - See comment Y N LPNC @ 30 weeks    V  SOCIAL WORK ASSESSMENT CSW met with pt to assess reason for [facility] @ 30 weeks & history of substance use.  Pt told CSW that she started [facility] at 16 weeks with [facility] office.  She was not able to attend all of her [facility] appointments regularly due to lack of transportation.  Pt admitted to smoking MJ "often" prior to pregnancy confirmation at 13  weeks.  Once pregnancy was confirmed, she told CSW that she stopped. CSW inquired about the positive UDS results 04/20/13.  Pt continued to deny any recent use of MJ & seemed surprised by results.  She denies other illegal substance use.  CSW explained hospital drug testing policy & informed her that a CPS report would be made if infants drug screen results are positive.  FOB was not present during the assessment. When FOB arrived, the pt asked the RN to call this CSW to come back to the room.  When CSW entered the room the FOB asked CSW why pt was being asked about MJ use & seemed highly irritated.  Pt gave CSW verbal permission to discuss what was previously discussed (privately), with FOB.  CSW explained hospital drug testing policy.  FOB continued to question this CSW about the results of a drug screen that were positive.  He adamantly told CSW that the pt or himself smoked MJ & made a verbal threat of suing the hospital.  CSW checked drug screen results & confirmed that pt was positive on 2 separate dates in different locations.  After explained information to the pt & FOB, he remembered that their neighbors smoke MJ "like a chimney," & provided that as explanation for positive results.  He looked at pt & said "that second hand smoke, I told you."  FOB was apologetic after he was able to identify the reason for positive drug screens.  Pt has all the necessary supplies for the infant & adequate family support.  She denies any history of depression or SI.  CSW will continue to monitor drug screen results & make a referral if necessary.      VI SOCIAL WORK PLAN Social Work Plan  No Further Intervention Required / No Barriers to Discharge   Type of pt/family education:   If child protective services report - county:   If child protective services report - date:   Information/referral to community resources comment:   Other social work plan:

## 2013-04-27 NOTE — Progress Notes (Signed)
UR chart review completed.  

## 2013-04-27 NOTE — Progress Notes (Signed)
Patient ID: Amanda Mccoy, female   DOB: 1981/08/01, 32 y.o.   MRN: [id number]   S:  Pt very uncomfortable with contractions  O:   Filed Vitals:   04/27/13 0003 04/27/13 0032 04/27/13 0104 04/27/13 0132  BP: 146/76 145/87 127/84 123/66  Pulse: 77 71 75 77  Temp:      Resp: 20 20 20 18   Height:      Weight:        Dilation: 5 Effacement (%): 100 Cervical Position: Anterior Station: -1 Presentation: Vertex Exam by:: Klover Priestly  AROM, clear  FHTs:  135, mod var, accels present, no decels TOCO:  q 2 minutes  A/P 32 y.o. [id number] at [redacted]w[redacted]d with IOL for GHTN - FHTs reactive - BP controlled, PIH labs negative - Progressing on pitocin, AROM  - Anticipate SVD soon  Napoleon Form, MD

## 2013-04-28 MED ORDER — IBUPROFEN 600 MG PO TABS: 600.0000 mg | ORAL_TABLET | Freq: Four times a day (QID) | ORAL | Status: DC

## 2013-04-28 NOTE — Discharge Summary (Signed)
Obstetric Discharge Summary Reason for Admission: induction of labor for [facility] Prenatal Procedures: none Intrapartum Procedures: spontaneous vaginal delivery Postpartum Procedures: none Complications-Operative and Postpartum: none Hemoglobin  Date Value Range Status  04/26/2013 9.4* 12.0 - 15.0 g/dL Final     HCT  Date Value Range Status  04/26/2013 28.5* 36.0 - 46.0 % Final   Ms Deblois was scheduled for IOL due to [facility] on the evening of 6/23.  Preeclampsia labs were nl and she did not require antihypertensives.  Her cx was favorable and Pitocin was started. She progressed quickly to SVD at 2:42am on 6/24. Occasionally she had slightly elevated BPs but not consistently.  SW was consulted due to hx of drug use during pregnancy- no barriers to d/c at this point (I believe infant drug screen may be pending still).  She is deemed to have received the full benefit of her hospital stay and she will be discharged home.  Physical Exam:  General: alert, cooperative and no distress Heart: RRR Lungs: nl effort Lochia: appropriate Uterine Fundus: firm DVT Evaluation: No evidence of DVT seen on physical exam.  Discharge Diagnoses: Term Pregnancy-delivered  Discharge Information: Date: 04/28/2013 Activity: pelvic rest Diet: routine Medications: PNV and Ibuprofen Condition: stable Instructions: refer to practice specific booklet Discharge to: home Follow-up Information   Follow up with [facility]. (Make a postpartum appointment for 6 weeks)    Contact information:   [address] [place] [address] [phone]      Newborn Data: Live born female  Birth Weight: 7 lb 14.6 oz (3590 g) APGAR: 9, 9  Home with mother. Breast and bottlefeeding; desires Depo for contraception.  Cam Hai 04/28/2013, 7:47 AM

## 2013-04-29 NOTE — Discharge Summary (Signed)
Attestation of Attending Supervision of Advanced Practitioner (PA/CNM/NP): Evaluation and management procedures were performed by the Advanced Practitioner under my supervision and collaboration.  I have reviewed the Advanced Practitioner's note and chart, and I agree with the management and plan.  Cherese Lozano, MD, FACOG Attending Obstetrician & Gynecologist Faculty Practice, [facility]  

## 2013-05-04 ENCOUNTER — Telehealth: Payer: Self-pay | Admitting: *Deleted

## 2013-05-04 NOTE — Telephone Encounter (Addendum)
Camillia Herter from the [facility] Department called stating pt delivered vaginally 04/27/2013, B/P today was 140/88 right arm, 130/86 left arm, denies HA, chest pain, dizziness, does have slight edema. Pt contact number is [phone].   Please advise.  Pt had severe range GHTN w/o evidence of preeclampsia.  These b/p's are still trending down and OK.  Pt to f/u in 2 weeks at [facility] for B/P check.

## 2013-06-08 ENCOUNTER — Ambulatory Visit: Payer: Medicaid Other | Admitting: Adult Health

## 2013-06-17 ENCOUNTER — Ambulatory Visit (INDEPENDENT_AMBULATORY_CARE_PROVIDER_SITE_OTHER): Payer: Medicaid Other | Admitting: Advanced Practice Midwife

## 2013-06-17 MED ORDER — MEDROXYPROGESTERONE ACETATE 150 MG/ML IM SUSP: 150.0000 mg | INTRAMUSCULAR | Status: DC

## 2013-06-17 NOTE — Progress Notes (Signed)
Amanda Mccoy is a 32 y.o. who presents for a postpartum visit. She is 6 weeks postpartum following a spontaneous vaginal delivery after IOL for GHTN.  I have fully reviewed the prenatal and intrapartum course. The delivery was at 40.1 gestational weeks.  Anesthesia: epidural. Postpartum course has been uneventful.  GHTN has resolved.. Baby's course has been uneventful. Baby is feeding by breast and bottle. Bleeding: no bleeding. Bowel function is normal. Bladder function is normal. Patient is sexually active. Contraception method is Depo-Provera injections. Postpartum depression screening: negative.  108/62  Review of Systems   Constitutional: Negative for fever and chills Eyes: Negative for visual disturbances Respiratory: Negative for shortness of breath, dyspnea Cardiovascular: Negative for chest pain or palpitations  Gastrointestinal: Negative for vomiting, diarrhea and constipation Genitourinary: Negative for dysuria and urgency Musculoskeletal: Negative for back pain, joint pain, myalgias  Neurological: Negative for dizziness and headaches   Objective:    There were no vitals filed for this visit. General:  alert, cooperative and no distress   Breasts:  negative  Lungs: clear to auscultation bilaterally  Heart:  regular rate and rhythm  Abdomen: Soft, nontender   Vulva:  normal  Vagina: normal vagina  Cervix:  closed  Corpus: Well involuted     Rectal Exam: no hemorrhoids        Assessment:    normal postpartum exam.  Plan:    1. Contraception: Depo-Provera injections 2. Follow up in: asap for depo or as needed.

## 2013-06-18 ENCOUNTER — Ambulatory Visit (INDEPENDENT_AMBULATORY_CARE_PROVIDER_SITE_OTHER): Payer: Medicaid Other | Admitting: Adult Health

## 2013-06-18 ENCOUNTER — Encounter: Payer: Self-pay | Admitting: Adult Health

## 2013-06-18 VITALS — BP 132/84 | Ht 66.0 in | Wt 170.0 lb

## 2013-06-18 DIAGNOSIS — Z309 Encounter for contraceptive management, unspecified: Secondary | ICD-10-CM

## 2013-06-18 DIAGNOSIS — Z3049 Encounter for surveillance of other contraceptives: Secondary | ICD-10-CM

## 2013-06-18 DIAGNOSIS — Z3202 Encounter for pregnancy test, result negative: Secondary | ICD-10-CM

## 2013-06-18 LAB — POCT URINE PREGNANCY: Preg Test, Ur: NEGATIVE

## 2013-06-18 MED ORDER — MEDROXYPROGESTERONE ACETATE 150 MG/ML IM SUSP
150.0000 mg | Freq: Once | INTRAMUSCULAR | Status: AC
  Administered 2013-06-18: 150 mg via INTRAMUSCULAR

## 2013-06-18 NOTE — Addendum Note (Signed)
Addended by: Criss Alvine on: 06/18/2013 01:08 PM   Modules accepted: Orders, Level of Service

## 2013-09-10 ENCOUNTER — Ambulatory Visit: Payer: Medicaid Other

## 2013-09-20 ENCOUNTER — Ambulatory Visit (INDEPENDENT_AMBULATORY_CARE_PROVIDER_SITE_OTHER): Payer: Medicaid Other | Admitting: Adult Health

## 2013-09-20 ENCOUNTER — Encounter: Payer: Self-pay | Admitting: Adult Health

## 2013-09-20 VITALS — BP 120/78 | Ht 66.5 in | Wt 169.0 lb

## 2013-09-20 DIAGNOSIS — Z3200 Encounter for pregnancy test, result unknown: Secondary | ICD-10-CM

## 2013-09-20 DIAGNOSIS — Z309 Encounter for contraceptive management, unspecified: Secondary | ICD-10-CM

## 2013-09-20 DIAGNOSIS — Z3049 Encounter for surveillance of other contraceptives: Secondary | ICD-10-CM

## 2013-09-20 DIAGNOSIS — Z3202 Encounter for pregnancy test, result negative: Secondary | ICD-10-CM

## 2013-09-20 MED ORDER — MEDROXYPROGESTERONE ACETATE 150 MG/ML IM SUSP
150.0000 mg | Freq: Once | INTRAMUSCULAR | Status: AC
  Administered 2013-09-20: 150 mg via INTRAMUSCULAR

## 2013-12-23 ENCOUNTER — Encounter: Payer: Self-pay | Admitting: Adult Health

## 2013-12-23 ENCOUNTER — Ambulatory Visit (INDEPENDENT_AMBULATORY_CARE_PROVIDER_SITE_OTHER): Payer: Medicaid Other | Admitting: Adult Health

## 2013-12-23 VITALS — BP 124/70 | Ht 66.0 in | Wt 174.6 lb

## 2013-12-23 DIAGNOSIS — Z309 Encounter for contraceptive management, unspecified: Secondary | ICD-10-CM

## 2013-12-23 DIAGNOSIS — Z3202 Encounter for pregnancy test, result negative: Secondary | ICD-10-CM

## 2013-12-23 DIAGNOSIS — Z3049 Encounter for surveillance of other contraceptives: Secondary | ICD-10-CM

## 2013-12-23 LAB — POCT URINE PREGNANCY: Preg Test, Ur: NEGATIVE

## 2013-12-23 MED ORDER — MEDROXYPROGESTERONE ACETATE 150 MG/ML IM SUSP
150.0000 mg | Freq: Once | INTRAMUSCULAR | Status: AC
  Administered 2013-12-23: 150 mg via INTRAMUSCULAR

## 2013-12-23 NOTE — Progress Notes (Signed)
Patient ID: Amanda StanfordShauna Mccoy, female   DOB: 06/25/1981, 33 y.o.   MRN: [id number] Depo provera 150 mg right deltoid, no complications, resulted negative pregnancy test. Pt to return in 12 weeks next injection.

## 2014-03-23 ENCOUNTER — Ambulatory Visit (INDEPENDENT_AMBULATORY_CARE_PROVIDER_SITE_OTHER): Payer: Medicaid Other | Admitting: Adult Health

## 2014-03-23 ENCOUNTER — Encounter: Payer: Self-pay | Admitting: Adult Health

## 2014-03-23 VITALS — BP 112/82 | Ht 66.5 in | Wt 165.0 lb

## 2014-03-23 DIAGNOSIS — Z3049 Encounter for surveillance of other contraceptives: Secondary | ICD-10-CM

## 2014-03-23 DIAGNOSIS — Z3202 Encounter for pregnancy test, result negative: Secondary | ICD-10-CM

## 2014-03-23 DIAGNOSIS — Z309 Encounter for contraceptive management, unspecified: Secondary | ICD-10-CM

## 2014-03-23 LAB — POCT URINE PREGNANCY: PREG TEST UR: NEGATIVE

## 2014-03-23 MED ORDER — MEDROXYPROGESTERONE ACETATE 150 MG/ML IM SUSP: 150.0000 mg | INTRAMUSCULAR | Status: DC

## 2014-03-23 MED ORDER — MEDROXYPROGESTERONE ACETATE 150 MG/ML IM SUSP
150.0000 mg | Freq: Once | INTRAMUSCULAR | Status: AC
  Administered 2014-03-23: 150 mg via INTRAMUSCULAR

## 2014-03-23 NOTE — Progress Notes (Signed)
Patient ID: Amanda Mccoy, female   DOB: 03/21/1981, 33 y.o.   MRN: [id number] Depo Provera 150 mg IM given left deltoid with no complications. Resulted negative pregnancy test. Pt to return in 12 weeks Depo Provera also due for  pap and physical with Rodena PietyFran Cresenzo.

## 2014-04-13 ENCOUNTER — Other Ambulatory Visit: Payer: Medicaid Other | Admitting: Advanced Practice Midwife

## 2014-04-20 ENCOUNTER — Other Ambulatory Visit: Payer: Medicaid Other | Admitting: Advanced Practice Midwife

## 2014-05-25 ENCOUNTER — Other Ambulatory Visit: Payer: Self-pay | Admitting: Advanced Practice Midwife

## 2014-06-08 ENCOUNTER — Other Ambulatory Visit (HOSPITAL_COMMUNITY)
Admission: RE | Admit: 2014-06-08 | Discharge: 2014-06-08 | Disposition: A | Discharge status: Home / Self Care | Payer: Medicaid Other | Source: Ambulatory Visit | Attending: Advanced Practice Midwife | Admitting: Advanced Practice Midwife

## 2014-06-08 ENCOUNTER — Ambulatory Visit (INDEPENDENT_AMBULATORY_CARE_PROVIDER_SITE_OTHER): Payer: Medicaid Other | Admitting: Advanced Practice Midwife

## 2014-06-08 ENCOUNTER — Encounter: Payer: Self-pay | Admitting: Advanced Practice Midwife

## 2014-06-08 VITALS — BP 130/62 | Ht 66.0 in | Wt 169.0 lb

## 2014-06-08 DIAGNOSIS — Z1151 Encounter for screening for human papillomavirus (HPV): Secondary | ICD-10-CM | POA: Insufficient documentation

## 2014-06-08 DIAGNOSIS — Z0000 Encounter for general adult medical examination without abnormal findings: Secondary | ICD-10-CM

## 2014-06-08 DIAGNOSIS — Z01419 Encounter for gynecological examination (general) (routine) without abnormal findings: Secondary | ICD-10-CM | POA: Insufficient documentation

## 2014-06-08 MED ORDER — MEDROXYPROGESTERONE ACETATE 150 MG/ML IM SUSP: 150.0000 mg | INTRAMUSCULAR | Status: DC

## 2014-06-08 NOTE — Progress Notes (Signed)
Amanda Mccoy 33 y.o.  Filed Vitals:   06/08/14 1130  BP: 130/62     Past Medical History: Past Medical History  Diagnosis Date  . Gestational diabetes     1st pregnancy    Past Surgical History: Past Surgical History  Procedure Laterality Date  . No past surgeries      Family History: Family History  Problem Relation Age of Onset  . Hypertension Other   . Stroke Other   . Cancer Other     prostate, colon, cervical  . Heart attack Other   . Coronary artery disease Other   . Hyperlipidemia Mother   . Hyperlipidemia Father   . Emphysema Maternal Grandmother   . Cancer Maternal Grandfather     colon  . Cancer Paternal Grandfather     prostate    Social History: History  Substance Use Topics  . Smoking status: Former Smoker    Types: Cigarettes  . Smokeless tobacco: Never Used  . Alcohol Use: No    Allergies: No Known Allergies   Current outpatient prescriptions:medroxyPROGESTERone (DEPO-PROVERA) 150 MG/ML injection, Inject 1 mL (150 mg total) into the muscle every 3 (three) months., Disp: 1 mL, Rfl: 3;  medroxyPROGESTERone (DEPO-PROVERA) 150 MG/ML injection, Inject 1 mL (150 mg total) into the muscle every 3 (three) months., Disp: 1 mL, Rfl: 3  History of Present Illness: Here for well woman.  Had ASCUS with HPV 6 years ago, did not have to have treatment.  Has had one normal pap since then and no abnormal. Amenorrheic with depo Review of Systems   Patient denies any headaches, blurred vision, shortness of breath, chest pain, abdominal pain, problems with bowel movements, urination, or intercourse.   Physical Exam: General:  Well developed, well nourished, no acute distress Skin:  Warm and dry Neck:  Midline trachea, normal thyroid Lungs; Clear to auscultation bilaterally Breast:  No dominant palpable mass, retraction, or nipple discharge Cardiovascular: Regular rate and rhythm Abdomen:  Soft, non tender, no hepatosplenomegaly Pelvic:  External  genitalia is normal in appearance.  The vagina is normal in appearance.  The cervix is bulbous.  Uterus is felt to be normal size, shape, and contour.  No adnexal masses or tenderness noted. Rectal: Good sphincter tone, no polyps, or hemorrhoids felt.   Extremities:  No swelling or varicosities noted Psych:  No mood changes   Impression:  Normal gyn   Plan:  contiue depo q 3 months Fasting labs lipids, TSH, C Met, CBC when fasting (routine screening)

## 2014-06-13 LAB — CYTOLOGY - PAP

## 2014-06-14 ENCOUNTER — Other Ambulatory Visit: Payer: Medicaid Other

## 2014-06-15 ENCOUNTER — Other Ambulatory Visit: Payer: Medicaid Other

## 2014-06-15 DIAGNOSIS — Z1329 Encounter for screening for other suspected endocrine disorder: Secondary | ICD-10-CM

## 2014-06-15 DIAGNOSIS — Z0000 Encounter for general adult medical examination without abnormal findings: Secondary | ICD-10-CM

## 2014-06-15 DIAGNOSIS — Z13220 Encounter for screening for lipoid disorders: Secondary | ICD-10-CM

## 2014-06-15 LAB — COMPREHENSIVE METABOLIC PANEL
ALBUMIN: 3.5 g/dL (ref 3.5–5.2)
ALT: 10 U/L (ref 0–35)
AST: 19 U/L (ref 0–37)
Alkaline Phosphatase: 39 U/L (ref 39–117)
BUN: 13 mg/dL (ref 6–23)
CALCIUM: 8.6 mg/dL (ref 8.4–10.5)
CHLORIDE: 109 meq/L (ref 96–112)
CO2: 25 mEq/L (ref 19–32)
CREATININE: 0.78 mg/dL (ref 0.50–1.10)
GLUCOSE: 87 mg/dL (ref 70–99)
POTASSIUM: 4.3 meq/L (ref 3.5–5.3)
Sodium: 141 mEq/L (ref 135–145)
Total Bilirubin: 0.3 mg/dL (ref 0.2–1.2)
Total Protein: 5.2 g/dL — ABNORMAL LOW (ref 6.0–8.3)

## 2014-06-15 LAB — CBC
HEMATOCRIT: 37.5 % (ref 36.0–46.0)
HEMOGLOBIN: 12.7 g/dL (ref 12.0–15.0)
MCH: 29.7 pg (ref 26.0–34.0)
MCHC: 33.9 g/dL (ref 30.0–36.0)
MCV: 87.8 fL (ref 78.0–100.0)
Platelets: 273 10*3/uL (ref 150–400)
RBC: 4.27 MIL/uL (ref 3.87–5.11)
RDW: 14.6 % (ref 11.5–15.5)
WBC: 7.6 10*3/uL (ref 4.0–10.5)

## 2014-06-15 LAB — LIPID PANEL
CHOLESTEROL: 147 mg/dL (ref 0–200)
HDL: 58 mg/dL (ref >39)
LDL Cholesterol: 81 mg/dL (ref 0–99)
TRIGLYCERIDES: 41 mg/dL (ref <150)
Total CHOL/HDL Ratio: 2.5 Ratio
VLDL: 8 mg/dL (ref 0–40)

## 2014-06-15 LAB — TSH: TSH: 1.323 u[IU]/mL (ref 0.350–4.500)

## 2014-06-21 ENCOUNTER — Ambulatory Visit (INDEPENDENT_AMBULATORY_CARE_PROVIDER_SITE_OTHER): Payer: Medicaid Other | Admitting: Adult Health

## 2014-06-21 ENCOUNTER — Encounter: Payer: Self-pay | Admitting: Adult Health

## 2014-06-21 DIAGNOSIS — Z3049 Encounter for surveillance of other contraceptives: Secondary | ICD-10-CM

## 2014-06-21 DIAGNOSIS — Z3042 Encounter for surveillance of injectable contraceptive: Secondary | ICD-10-CM

## 2014-06-21 DIAGNOSIS — Z3202 Encounter for pregnancy test, result negative: Secondary | ICD-10-CM

## 2014-06-21 LAB — POCT URINE PREGNANCY: PREG TEST UR: NEGATIVE

## 2014-06-21 MED ORDER — MEDROXYPROGESTERONE ACETATE 150 MG/ML IM SUSP
150.0000 mg | Freq: Once | INTRAMUSCULAR | Status: AC
  Administered 2014-06-21: 150 mg via INTRAMUSCULAR

## 2014-06-23 ENCOUNTER — Ambulatory Visit: Payer: Medicaid Other

## 2014-09-05 ENCOUNTER — Encounter: Payer: Self-pay | Admitting: Adult Health

## 2014-09-13 ENCOUNTER — Ambulatory Visit (INDEPENDENT_AMBULATORY_CARE_PROVIDER_SITE_OTHER): Payer: Medicaid Other | Admitting: Adult Health

## 2014-09-13 ENCOUNTER — Encounter: Payer: Self-pay | Admitting: Adult Health

## 2014-09-13 DIAGNOSIS — Z3042 Encounter for surveillance of injectable contraceptive: Secondary | ICD-10-CM

## 2014-09-13 DIAGNOSIS — Z3202 Encounter for pregnancy test, result negative: Secondary | ICD-10-CM

## 2014-09-13 LAB — POCT URINE PREGNANCY: Preg Test, Ur: NEGATIVE

## 2014-09-13 MED ORDER — MEDROXYPROGESTERONE ACETATE 150 MG/ML IM SUSP
150.0000 mg | Freq: Once | INTRAMUSCULAR | Status: AC
  Administered 2014-09-13: 150 mg via INTRAMUSCULAR

## 2014-12-05 ENCOUNTER — Telehealth: Payer: Self-pay | Admitting: *Deleted

## 2014-12-05 NOTE — Telephone Encounter (Signed)
Pt got last depo 11/10  Can pick up rx every 90 days per pt, ok to get 2/11

## 2014-12-05 NOTE — Telephone Encounter (Signed)
Pt states she is unable to pick up her RX for Depo Provera before 12/15/14 due to insurance. Pt scheduled appt for Depo injection is 12/13/2012, would it be ok for the pt to get the depo injection 2 days after injection is due?

## 2014-12-06 ENCOUNTER — Ambulatory Visit: Payer: Medicaid Other

## 2014-12-12 ENCOUNTER — Ambulatory Visit (INDEPENDENT_AMBULATORY_CARE_PROVIDER_SITE_OTHER): Payer: Medicaid Other | Admitting: *Deleted

## 2014-12-12 ENCOUNTER — Encounter: Payer: Self-pay | Admitting: *Deleted

## 2014-12-12 DIAGNOSIS — Z3042 Encounter for surveillance of injectable contraceptive: Secondary | ICD-10-CM

## 2014-12-12 DIAGNOSIS — Z3202 Encounter for pregnancy test, result negative: Secondary | ICD-10-CM

## 2014-12-12 LAB — POCT URINE PREGNANCY: Preg Test, Ur: NEGATIVE

## 2014-12-12 MED ORDER — MEDROXYPROGESTERONE ACETATE 150 MG/ML IM SUSP
150.0000 mg | Freq: Once | INTRAMUSCULAR | Status: AC
  Administered 2014-12-12: 150 mg via INTRAMUSCULAR

## 2014-12-12 NOTE — Progress Notes (Signed)
Pt here for Depo shot. Pt reports some cramping, but she feels like that will stop after getting shot. I advised to call us if it don't stop. Return in 12 weeks for next shot. JSY

## 2015-03-06 ENCOUNTER — Ambulatory Visit: Payer: Medicaid Other

## 2015-03-08 ENCOUNTER — Ambulatory Visit (INDEPENDENT_AMBULATORY_CARE_PROVIDER_SITE_OTHER): Payer: Medicaid Other | Admitting: *Deleted

## 2015-03-08 ENCOUNTER — Encounter: Payer: Self-pay | Admitting: *Deleted

## 2015-03-08 DIAGNOSIS — Z3202 Encounter for pregnancy test, result negative: Secondary | ICD-10-CM

## 2015-03-08 DIAGNOSIS — Z3042 Encounter for surveillance of injectable contraceptive: Secondary | ICD-10-CM

## 2015-03-08 LAB — POCT URINE PREGNANCY: Preg Test, Ur: NEGATIVE

## 2015-03-08 MED ORDER — MEDROXYPROGESTERONE ACETATE 150 MG/ML IM SUSP
150.0000 mg | Freq: Once | INTRAMUSCULAR | Status: AC
  Administered 2015-03-08: 150 mg via INTRAMUSCULAR

## 2015-03-08 NOTE — Progress Notes (Signed)
Pt here for Depo. Reports no problems at this time. Return in 12 weeks for next shot. JSY 

## 2015-05-31 ENCOUNTER — Encounter: Payer: Self-pay | Admitting: Obstetrics & Gynecology

## 2015-05-31 ENCOUNTER — Ambulatory Visit: Payer: Medicaid Other

## 2015-06-05 ENCOUNTER — Telehealth: Payer: Self-pay | Admitting: *Deleted

## 2015-06-05 ENCOUNTER — Other Ambulatory Visit: Payer: Self-pay | Admitting: *Deleted

## 2015-06-05 MED ORDER — MEDROXYPROGESTERONE ACETATE 150 MG/ML IM SUSP: 150.0000 mg | INTRAMUSCULAR | Status: DC

## 2015-06-05 NOTE — Telephone Encounter (Signed)
Refill request placed in epic for provider to approve.

## 2015-06-06 ENCOUNTER — Ambulatory Visit: Payer: Medicaid Other

## 2015-06-07 ENCOUNTER — Ambulatory Visit (INDEPENDENT_AMBULATORY_CARE_PROVIDER_SITE_OTHER): Payer: Medicaid Other | Admitting: *Deleted

## 2015-06-07 ENCOUNTER — Encounter: Payer: Self-pay | Admitting: *Deleted

## 2015-06-07 DIAGNOSIS — Z3202 Encounter for pregnancy test, result negative: Secondary | ICD-10-CM

## 2015-06-07 DIAGNOSIS — Z3042 Encounter for surveillance of injectable contraceptive: Secondary | ICD-10-CM

## 2015-06-07 LAB — POCT URINE PREGNANCY: PREG TEST UR: NEGATIVE

## 2015-06-07 MED ORDER — MEDROXYPROGESTERONE ACETATE 150 MG/ML IM SUSP
150.0000 mg | Freq: Once | INTRAMUSCULAR | Status: AC
  Administered 2015-06-07: 150 mg via INTRAMUSCULAR

## 2015-06-07 NOTE — Progress Notes (Signed)
Pt here for Depo. Reports no problems at this time. Return in 12 weeks for next shot. JSY 

## 2015-08-30 ENCOUNTER — Ambulatory Visit: Payer: Medicaid Other

## 2015-08-31 ENCOUNTER — Encounter: Payer: Self-pay | Admitting: *Deleted

## 2015-08-31 ENCOUNTER — Ambulatory Visit (INDEPENDENT_AMBULATORY_CARE_PROVIDER_SITE_OTHER): Payer: Medicaid Other | Admitting: *Deleted

## 2015-08-31 DIAGNOSIS — Z3202 Encounter for pregnancy test, result negative: Secondary | ICD-10-CM | POA: Diagnosis not present

## 2015-08-31 DIAGNOSIS — Z3042 Encounter for surveillance of injectable contraceptive: Secondary | ICD-10-CM | POA: Diagnosis not present

## 2015-08-31 LAB — POCT URINE PREGNANCY: PREG TEST UR: NEGATIVE

## 2015-08-31 MED ORDER — MEDROXYPROGESTERONE ACETATE 150 MG/ML IM SUSP
150.0000 mg | Freq: Once | INTRAMUSCULAR | Status: AC
  Administered 2015-08-31: 150 mg via INTRAMUSCULAR

## 2015-08-31 NOTE — Progress Notes (Signed)
Pt here for Depo. Reports no problems at this time. Return in 12 weeks for next shot. JSY 

## 2015-09-13 ENCOUNTER — Other Ambulatory Visit: Payer: Medicaid Other | Admitting: Advanced Practice Midwife

## 2015-09-26 ENCOUNTER — Other Ambulatory Visit: Payer: Medicaid Other | Admitting: Advanced Practice Midwife

## 2015-11-23 ENCOUNTER — Encounter: Payer: Self-pay | Admitting: *Deleted

## 2015-11-23 ENCOUNTER — Ambulatory Visit (INDEPENDENT_AMBULATORY_CARE_PROVIDER_SITE_OTHER): Payer: Medicaid Other | Admitting: *Deleted

## 2015-11-23 ENCOUNTER — Ambulatory Visit: Payer: Medicaid Other

## 2015-11-23 DIAGNOSIS — Z3042 Encounter for surveillance of injectable contraceptive: Secondary | ICD-10-CM

## 2015-11-23 DIAGNOSIS — Z3202 Encounter for pregnancy test, result negative: Secondary | ICD-10-CM

## 2015-11-23 LAB — POCT URINE PREGNANCY: Preg Test, Ur: NEGATIVE

## 2015-11-23 MED ORDER — MEDROXYPROGESTERONE ACETATE 150 MG/ML IM SUSP
150.0000 mg | Freq: Once | INTRAMUSCULAR | Status: AC
  Administered 2015-11-23: 150 mg via INTRAMUSCULAR

## 2015-11-23 NOTE — Progress Notes (Signed)
Pt here for Depo. Reports no problems at this time. Return in 12 weeks for next shot. JSY 

## 2016-02-12 ENCOUNTER — Other Ambulatory Visit: Payer: Self-pay | Admitting: Advanced Practice Midwife

## 2016-02-14 ENCOUNTER — Encounter: Payer: Self-pay | Admitting: *Deleted

## 2016-02-14 ENCOUNTER — Ambulatory Visit (INDEPENDENT_AMBULATORY_CARE_PROVIDER_SITE_OTHER): Payer: Medicaid Other | Admitting: *Deleted

## 2016-02-14 DIAGNOSIS — Z3202 Encounter for pregnancy test, result negative: Secondary | ICD-10-CM

## 2016-02-14 DIAGNOSIS — Z3042 Encounter for surveillance of injectable contraceptive: Secondary | ICD-10-CM

## 2016-02-14 DIAGNOSIS — Z3200 Encounter for pregnancy test, result unknown: Secondary | ICD-10-CM

## 2016-02-14 LAB — POCT URINE PREGNANCY: Preg Test, Ur: NEGATIVE

## 2016-02-14 MED ORDER — MEDROXYPROGESTERONE ACETATE 150 MG/ML IM SUSP: 150.0000 mg | INTRAMUSCULAR | Status: DC

## 2016-02-14 MED ORDER — MEDROXYPROGESTERONE ACETATE 150 MG/ML IM SUSP
150.0000 mg | Freq: Once | INTRAMUSCULAR | Status: AC
  Administered 2016-02-14: 150 mg via INTRAMUSCULAR

## 2016-02-14 NOTE — Progress Notes (Signed)
Patient ID: Amanda StanfordShauna Kozma, female   DOB: 10/17/1981, 35 y.o.   MRN: [id number] Pt here today for DEPO injection. Pt given injection and tolerated well. Pt denies any problems or concerns at this time.

## 2016-02-15 ENCOUNTER — Ambulatory Visit: Payer: Medicaid Other

## 2016-05-08 ENCOUNTER — Encounter: Payer: Self-pay | Admitting: *Deleted

## 2016-05-08 ENCOUNTER — Ambulatory Visit (INDEPENDENT_AMBULATORY_CARE_PROVIDER_SITE_OTHER): Payer: Medicaid Other | Admitting: *Deleted

## 2016-05-08 DIAGNOSIS — Z3202 Encounter for pregnancy test, result negative: Secondary | ICD-10-CM

## 2016-05-08 DIAGNOSIS — Z3042 Encounter for surveillance of injectable contraceptive: Secondary | ICD-10-CM

## 2016-05-08 LAB — POCT URINE PREGNANCY: Preg Test, Ur: NEGATIVE

## 2016-05-08 MED ORDER — MEDROXYPROGESTERONE ACETATE 150 MG/ML IM SUSP
150.0000 mg | Freq: Once | INTRAMUSCULAR | Status: AC
  Administered 2016-05-08: 150 mg via INTRAMUSCULAR

## 2016-05-08 NOTE — Progress Notes (Signed)
Patient ID: Eileen StanfordShauna Koopmann, female   DOB: Jul 15, 1981, 35 y.o.   MRN: [id number] Depo Provera 150 mg IM given left deltoid with no complications, negative pregnancy test. Pt to return in 12 weeks for next injection.

## 2016-07-31 ENCOUNTER — Encounter: Payer: Self-pay | Admitting: *Deleted

## 2016-07-31 ENCOUNTER — Ambulatory Visit: Payer: Medicaid Other

## 2016-08-06 ENCOUNTER — Ambulatory Visit (INDEPENDENT_AMBULATORY_CARE_PROVIDER_SITE_OTHER): Payer: Medicaid Other | Admitting: *Deleted

## 2016-08-06 DIAGNOSIS — Z3042 Encounter for surveillance of injectable contraceptive: Secondary | ICD-10-CM

## 2016-08-06 DIAGNOSIS — Z308 Encounter for other contraceptive management: Secondary | ICD-10-CM

## 2016-08-06 DIAGNOSIS — Z3202 Encounter for pregnancy test, result negative: Secondary | ICD-10-CM | POA: Diagnosis not present

## 2016-08-06 LAB — POCT URINE PREGNANCY: Preg Test, Ur: NEGATIVE

## 2016-08-06 MED ORDER — MEDROXYPROGESTERONE ACETATE 150 MG/ML IM SUSP
150.0000 mg | Freq: Once | INTRAMUSCULAR | Status: AC
  Administered 2016-08-06: 150 mg via INTRAMUSCULAR

## 2016-08-06 NOTE — Progress Notes (Signed)
Depo Provera 150 mg IM given right deltoid with no complications, negative pregnancy test. Pt to return in 12 weeks for next injection. 

## 2016-10-31 ENCOUNTER — Encounter: Payer: Self-pay | Admitting: *Deleted

## 2016-10-31 ENCOUNTER — Ambulatory Visit: Payer: Medicaid Other

## 2016-10-31 ENCOUNTER — Ambulatory Visit (INDEPENDENT_AMBULATORY_CARE_PROVIDER_SITE_OTHER): Payer: Medicaid Other | Admitting: *Deleted

## 2016-10-31 DIAGNOSIS — Z3202 Encounter for pregnancy test, result negative: Secondary | ICD-10-CM | POA: Diagnosis not present

## 2016-10-31 DIAGNOSIS — Z3042 Encounter for surveillance of injectable contraceptive: Secondary | ICD-10-CM

## 2016-10-31 DIAGNOSIS — Z308 Encounter for other contraceptive management: Secondary | ICD-10-CM

## 2016-10-31 LAB — POCT URINE PREGNANCY: Preg Test, Ur: NEGATIVE

## 2016-10-31 MED ORDER — MEDROXYPROGESTERONE ACETATE 150 MG/ML IM SUSP
150.0000 mg | Freq: Once | INTRAMUSCULAR | Status: AC
  Administered 2016-10-31: 150 mg via INTRAMUSCULAR

## 2016-10-31 NOTE — Progress Notes (Signed)
Pt here for Depo. Pt tolerated shot well. Return in 12 weeks for next shot. JSY 

## 2017-01-23 ENCOUNTER — Ambulatory Visit (INDEPENDENT_AMBULATORY_CARE_PROVIDER_SITE_OTHER): Payer: Medicaid Other | Admitting: *Deleted

## 2017-01-23 ENCOUNTER — Encounter: Payer: Self-pay | Admitting: *Deleted

## 2017-01-23 ENCOUNTER — Ambulatory Visit: Payer: Medicaid Other

## 2017-01-23 DIAGNOSIS — Z3202 Encounter for pregnancy test, result negative: Secondary | ICD-10-CM

## 2017-01-23 DIAGNOSIS — Z3042 Encounter for surveillance of injectable contraceptive: Secondary | ICD-10-CM

## 2017-01-23 DIAGNOSIS — Z308 Encounter for other contraceptive management: Secondary | ICD-10-CM

## 2017-01-23 LAB — POCT URINE PREGNANCY: PREG TEST UR: NEGATIVE

## 2017-01-23 MED ORDER — MEDROXYPROGESTERONE ACETATE 150 MG/ML IM SUSP
150.0000 mg | Freq: Once | INTRAMUSCULAR | Status: AC
  Administered 2017-01-23: 150 mg via INTRAMUSCULAR

## 2017-01-23 NOTE — Progress Notes (Signed)
Pt here for Depo. Pt tolerated shot well. Return in 12 weeks for next shot. JSY 

## 2017-04-14 ENCOUNTER — Other Ambulatory Visit: Payer: Self-pay | Admitting: Obstetrics and Gynecology

## 2017-04-17 ENCOUNTER — Encounter: Payer: Self-pay | Admitting: *Deleted

## 2017-04-17 ENCOUNTER — Ambulatory Visit (INDEPENDENT_AMBULATORY_CARE_PROVIDER_SITE_OTHER): Payer: Medicaid Other | Admitting: *Deleted

## 2017-04-17 ENCOUNTER — Telehealth: Payer: Self-pay | Admitting: *Deleted

## 2017-04-17 DIAGNOSIS — Z308 Encounter for other contraceptive management: Secondary | ICD-10-CM

## 2017-04-17 DIAGNOSIS — Z3202 Encounter for pregnancy test, result negative: Secondary | ICD-10-CM | POA: Diagnosis not present

## 2017-04-17 DIAGNOSIS — Z3040 Encounter for surveillance of contraceptives, unspecified: Secondary | ICD-10-CM | POA: Diagnosis not present

## 2017-04-17 LAB — POCT URINE PREGNANCY: Preg Test, Ur: NEGATIVE

## 2017-04-17 MED ORDER — MEDROXYPROGESTERONE ACETATE 150 MG/ML IM SUSP
150.0000 mg | Freq: Once | INTRAMUSCULAR | Status: AC
  Administered 2017-04-17: 150 mg via INTRAMUSCULAR

## 2017-04-17 NOTE — Progress Notes (Signed)
Pt here for Depo. Pt tolerated shot well. Return in 12 weeks for next shot. JSY 

## 2017-04-17 NOTE — Telephone Encounter (Signed)
LMOVM that pt would need to make an appt to be seen by a provider in order to get a refill on her Depo.

## 2017-04-17 NOTE — Telephone Encounter (Signed)
Patient needs a gyn appt to review hx and med Rx.

## 2017-07-04 ENCOUNTER — Other Ambulatory Visit: Payer: Self-pay | Admitting: Obstetrics and Gynecology

## 2017-07-09 ENCOUNTER — Ambulatory Visit (INDEPENDENT_AMBULATORY_CARE_PROVIDER_SITE_OTHER): Payer: Medicaid Other | Admitting: *Deleted

## 2017-07-09 ENCOUNTER — Encounter: Payer: Self-pay | Admitting: *Deleted

## 2017-07-09 VITALS — Wt 213.0 lb

## 2017-07-09 DIAGNOSIS — Z3202 Encounter for pregnancy test, result negative: Secondary | ICD-10-CM | POA: Diagnosis not present

## 2017-07-09 DIAGNOSIS — Z3042 Encounter for surveillance of injectable contraceptive: Secondary | ICD-10-CM

## 2017-07-09 LAB — POCT URINE PREGNANCY: PREG TEST UR: NEGATIVE

## 2017-07-09 MED ORDER — MEDROXYPROGESTERONE ACETATE 150 MG/ML IM SUSP
150.0000 mg | Freq: Once | INTRAMUSCULAR | Status: AC
  Administered 2017-07-09: 150 mg via INTRAMUSCULAR

## 2017-07-09 NOTE — Progress Notes (Signed)
Depo Provera 150mg IM given in right deltoid with no complications. Pt to return in 12 weeks for next injection.  

## 2017-07-10 ENCOUNTER — Ambulatory Visit: Payer: Medicaid Other

## 2017-09-19 ENCOUNTER — Other Ambulatory Visit: Payer: Self-pay | Admitting: Obstetrics and Gynecology

## 2017-09-23 ENCOUNTER — Telehealth: Payer: Self-pay | Admitting: *Deleted

## 2017-09-23 NOTE — Telephone Encounter (Signed)
Informed patient that she will need a pap/physical in order for medicaid to pay for Depo. Pt verbalized understanding and would like to schedule appt.

## 2017-10-01 ENCOUNTER — Ambulatory Visit: Payer: Medicaid Other

## 2017-10-21 ENCOUNTER — Ambulatory Visit: Payer: Medicaid Other | Admitting: Advanced Practice Midwife

## 2017-10-21 ENCOUNTER — Encounter: Payer: Self-pay | Admitting: Advanced Practice Midwife

## 2017-10-21 ENCOUNTER — Other Ambulatory Visit (HOSPITAL_COMMUNITY)
Admission: RE | Admit: 2017-10-21 | Discharge: 2017-10-21 | Disposition: A | Discharge status: Home / Self Care | Payer: Medicaid Other | Source: Ambulatory Visit | Attending: Advanced Practice Midwife | Admitting: Advanced Practice Midwife

## 2017-10-21 VITALS — BP 118/72 | HR 80 | Ht 66.0 in | Wt 212.0 lb

## 2017-10-21 DIAGNOSIS — Z01419 Encounter for gynecological examination (general) (routine) without abnormal findings: Secondary | ICD-10-CM | POA: Insufficient documentation

## 2017-10-21 DIAGNOSIS — Z0000 Encounter for general adult medical examination without abnormal findings: Secondary | ICD-10-CM

## 2017-10-21 MED ORDER — NORGESTIM-ETH ESTRAD TRIPHASIC 0.18/0.215/0.25 MG-25 MCG PO TABS: 1.0000 | ORAL_TABLET | Freq: Every day | ORAL | 11 refills | Status: DC

## 2017-10-21 NOTE — Progress Notes (Signed)
Amanda Mccoy 36 y.o.  Vitals:   10/21/17 0843  BP: 118/72  Pulse: 80     Filed Weights   10/21/17 0843  Weight: 212 lb (96.2 kg)    Past Medical History: Past Medical History:  Diagnosis Date  . Gestational diabetes    1st pregnancy    Past Surgical History: Past Surgical History:  Procedure Laterality Date  . NO PAST SURGERIES      Family History: Family History  Problem Relation Age of Onset  . Hyperlipidemia Mother   . Hyperlipidemia Father   . Emphysema Maternal Grandmother   . Cancer Maternal Grandfather        colon  . Cancer Paternal Grandfather        prostate  . Hypertension Other   . Stroke Other   . Cancer Other        prostate, colon, cervical  . Heart attack Other   . Coronary artery disease Other     Social History: Social History   Tobacco Use  . Smoking status: Former Smoker    Types: Cigarettes  . Smokeless tobacco: Never Used  Substance Use Topics  . Alcohol use: No  . Drug use: No    Comment: not since high school.    Allergies: No Known Allergies    Current Outpatient Medications:  .  medroxyPROGESTERone (DEPO-PROVERA) 150 MG/ML injection, INJECT 1ML INTO THE MUSCLE EVERY 3 MONTHS, Disp: 1 mL, Rfl: 0 .  Multiple Vitamin (MULTIVITAMIN) tablet, Take 1 tablet by mouth daily., Disp: , Rfl:   History of Present Illness: Here for pap/physical.  Last pap normal 3 years ago.  Depo was due last month, but wants to go back on COCs d/t weight gain/bleeding.     Review of Systems   Patient denies any headaches, blurred vision, shortness of breath, chest pain, abdominal pain, problems with bowel movements, urination, or intercourse.   Physical Exam: General:  Well developed, well nourished, no acute distress Skin:  Warm and dry Neck:  Midline trachea, normal thyroid Lungs; Clear to auscultation bilaterally Breast:  No dominant palpable mass, retraction, or nipple discharge Cardiovascular: Regular rate and rhythm Abdomen:  Soft,  non tender, no hepatosplenomegaly Pelvic:  External genitalia is normal in appearance.  The vagina is normal in appearance.  The cervix is bulbous.  Uterus is felt to be normal size, shape, and contour. Exam limited by habitus, but no adnexal masses or tenderness noted.  Extremities:  No swelling or varicosities noted Psych:  No mood changes.     Impression: normal GYN exam     Plan: Start COCs now BU for 1st pack

## 2017-10-22 LAB — CYTOLOGY - PAP
Chlamydia: NEGATIVE
Diagnosis: NEGATIVE
HPV (WINDOPATH): NOT DETECTED
Neisseria Gonorrhea: NEGATIVE

## 2018-09-15 ENCOUNTER — Other Ambulatory Visit: Payer: Self-pay | Admitting: Advanced Practice Midwife

## 2019-08-19 ENCOUNTER — Other Ambulatory Visit: Payer: Self-pay | Admitting: Advanced Practice Midwife

## 2019-08-20 NOTE — Telephone Encounter (Signed)
Patient called requesting a refill on her BCP as she will be out of pills tomorrow.  Initial request was sent to [facility].

## 2019-09-20 ENCOUNTER — Telehealth: Payer: Self-pay | Admitting: *Deleted

## 2019-09-20 NOTE — Telephone Encounter (Signed)
Returned patient's call.  VM full so unable to leave message.  She called for a refill but note from last refill states she needs an appt for any future refills.  If she calls back, offer appt for this afternoon with Manus Gunning.

## 2019-09-20 NOTE — Telephone Encounter (Signed)
Patient called for refill on her BCP.

## 2019-09-21 ENCOUNTER — Telehealth: Payer: Self-pay | Admitting: Adult Health

## 2019-09-21 NOTE — Telephone Encounter (Signed)

## 2019-09-22 ENCOUNTER — Ambulatory Visit: Payer: Medicaid Other | Admitting: Adult Health

## 2019-09-22 ENCOUNTER — Other Ambulatory Visit: Payer: Self-pay

## 2019-09-22 ENCOUNTER — Encounter: Payer: Self-pay | Admitting: Adult Health

## 2019-09-22 VITALS — BP 125/80 | HR 100 | Ht 66.5 in | Wt 207.0 lb

## 2019-09-22 DIAGNOSIS — Z3041 Encounter for surveillance of contraceptive pills: Secondary | ICD-10-CM | POA: Diagnosis not present

## 2019-09-22 MED ORDER — NORGESTIM-ETH ESTRAD TRIPHASIC 0.18/0.215/0.25 MG-25 MCG PO TABS: 1.0000 | ORAL_TABLET | Freq: Every day | ORAL | 4 refills | Status: DC

## 2019-09-22 NOTE — Progress Notes (Signed)
  Subjective:     Patient ID: Amanda Mccoy, female   DOB: 1980-12-26, 38 y.o.   MRN: [id number]  HPI [name] is a 38 year old black female, single in long term relationship, like 15 years, [id number], in to get OCs refills, needs physical but declines today on period, last pap 10/21/17 was normal with negative HPV.   Review of Systems Patient denies any headaches, hearing loss, fatigue, blurred vision, shortness of breath, chest pain, abdominal pain, problems with bowel movements, urination, or intercourse. No joint pain or mood swings. Happy with OCs  Reviewed past medical,surgical, social and family history. Reviewed medications and allergies.     Objective:   Physical Exam BP 125/80 (BP Location: Left Arm, Patient Position: Sitting, Cuff Size: Large)   Pulse 100   Ht 5' 6.5" (1.689 m)   Wt 207 lb (93.9 kg)   LMP 09/22/2019   BMI 32.91 kg/m  Skin warm and dry. Neck: mid line trachea, normal thyroid, good ROM, no lymphadenopathy noted. Lungs: clear to ausculation bilaterally. Cardiovascular: regular rate and rhythm. Fall risk is low    Assessment:     1. Encounter for surveillance of contraceptive pills     Plan:      Will refill OCs Meds ordered this encounter  Medications  . Norgestimate-Ethinyl Estradiol Triphasic (TRI-LO-SPRINTEC) 0.18/0.215/0.25 MG-25 MCG tab    Sig: Take 1 tablet by mouth daily.    Dispense:  84 tablet    Refill:  4    Order Specific Question:   Supervising Provider    Answer:   Amanda Mccoy [[id number]]  Return 12/3 for physical and fasting labs, A1c

## 2019-10-06 ENCOUNTER — Telehealth: Payer: Self-pay | Admitting: Adult Health

## 2019-10-06 NOTE — Telephone Encounter (Signed)

## 2019-10-07 ENCOUNTER — Other Ambulatory Visit: Payer: Self-pay

## 2019-10-07 ENCOUNTER — Encounter: Payer: Self-pay | Admitting: Adult Health

## 2019-10-07 ENCOUNTER — Ambulatory Visit (INDEPENDENT_AMBULATORY_CARE_PROVIDER_SITE_OTHER): Payer: Medicaid Other | Admitting: Adult Health

## 2019-10-07 ENCOUNTER — Other Ambulatory Visit (HOSPITAL_COMMUNITY)
Admission: RE | Admit: 2019-10-07 | Discharge: 2019-10-07 | Disposition: A | Discharge status: Home / Self Care | Payer: Medicaid Other | Source: Ambulatory Visit | Attending: Adult Health | Admitting: Adult Health

## 2019-10-07 VITALS — BP 125/75 | HR 69 | Ht 65.0 in | Wt 206.0 lb

## 2019-10-07 DIAGNOSIS — Z131 Encounter for screening for diabetes mellitus: Secondary | ICD-10-CM | POA: Diagnosis not present

## 2019-10-07 DIAGNOSIS — Z113 Encounter for screening for infections with a predominantly sexual mode of transmission: Secondary | ICD-10-CM | POA: Insufficient documentation

## 2019-10-07 DIAGNOSIS — Z01419 Encounter for gynecological examination (general) (routine) without abnormal findings: Secondary | ICD-10-CM | POA: Diagnosis not present

## 2019-10-07 DIAGNOSIS — Z0000 Encounter for general adult medical examination without abnormal findings: Secondary | ICD-10-CM

## 2019-10-07 NOTE — Progress Notes (Signed)
Patient ID: Amanda Mccoy, female   DOB: Jun 24, 1981, 38 y.o.   MRN: [id number] History of Present Illness:  Amanda Mccoy is a 38 year old black female, single, [id number], in for a well woman gyn exam.She had a normal pap with negative HPV 10/21/17.But she wants pap today and STD testing and labs.  Current Medications, Allergies, Past Medical History, Past Surgical History, Family History and Social History were reviewed in [employer] record.     Review of Systems:  Patient denies any headaches, hearing loss, fatigue, blurred vision, shortness of breath, chest pain, abdominal pain, problems with bowel movements, urination, or intercourse. No joint pain or mood swings. She is  On OCs and happy with them,but has occasional LLQ pain at times, lik dull ache with period.  Has  spur on left foot   Physical Exam:BP 125/75 (BP Location: Left Arm, Patient Position: Sitting, Cuff Size: Large)   Pulse 69   Ht 5\' 5"  (1.651 m)   Wt 206 lb (93.4 kg)   LMP 09/22/2019   BMI 34.28 kg/m  General:  Well developed, well nourished, no acute distress Skin:  Warm and dry Neck:  Midline trachea, normal thyroid, good ROM, no lymphadenopathy Lungs; Clear to auscultation bilaterally Breast:  No dominant palpable mass, retraction, or nipple discharge Cardiovascular: Regular rate and rhythm Abdomen:  Soft, non tender, no hepatosplenomegaly Pelvic:  External genitalia is normal in appearance, no lesions.  The vagina is normal in appearance. Urethra has no lesions or masses. The cervix is bulbous.Pap with GC/CHL and high risk HPV 16/18 genotyping performed.   Uterus is felt to be normal size, shape, and contour.  No adnexal masses or tenderness noted.Bladder is non tender, no masses felt. Extremities/musculoskeletal:  No swelling or varicosities noted, no clubbing or cyanosis Psych:  No mood changes, alert and cooperative,seems happy Fall risk is low PHQ 2 score is 1. Co exam with Weyman Croon FNP  student.  Impression and Plan: 1. Encounter for gynecological examination with Papanicolaou smear of cervix Pap sent  Physical in 1 year Pap in 3 if normal Mammogram at 40 Check CBC,CMP,TSH and lipids Colonoscopy at 45   2. Screening examination for STD (sexually transmitted disease) Check HIV and RPR  3. Screening for diabetes mellitus Check A1c

## 2019-10-07 NOTE — Addendum Note (Signed)
Addended by: Linton Rump on: 10/07/2019 11:53 AM   Modules accepted: Orders

## 2019-10-08 ENCOUNTER — Telehealth: Payer: Self-pay | Admitting: Adult Health

## 2019-10-08 LAB — CBC
Hematocrit: 37.7 % (ref 34.0–46.6)
Hemoglobin: 12.7 g/dL (ref 11.1–15.9)
MCH: 31.2 pg (ref 26.6–33.0)
MCHC: 33.7 g/dL (ref 31.5–35.7)
MCV: 93 fL (ref 79–97)
Platelets: 283 10*3/uL (ref 150–450)
RBC: 4.07 x10E6/uL (ref 3.77–5.28)
RDW: 12.7 % (ref 11.7–15.4)
WBC: 12.9 10*3/uL — ABNORMAL HIGH (ref 3.4–10.8)

## 2019-10-08 LAB — HIV ANTIBODY (ROUTINE TESTING W REFLEX): HIV Screen 4th Generation wRfx: NONREACTIVE

## 2019-10-08 LAB — COMPREHENSIVE METABOLIC PANEL
ALT: 8 IU/L (ref 0–32)
AST: 17 IU/L (ref 0–40)
Albumin/Globulin Ratio: 1.5 (ref 1.2–2.2)
Albumin: 4 g/dL (ref 3.8–4.8)
Alkaline Phosphatase: 55 IU/L (ref 39–117)
BUN/Creatinine Ratio: 14 (ref 9–23)
BUN: 10 mg/dL (ref 6–20)
Bilirubin Total: 0.3 mg/dL (ref 0.0–1.2)
CO2: 24 mmol/L (ref 20–29)
Calcium: 9 mg/dL (ref 8.7–10.2)
Chloride: 103 mmol/L (ref 96–106)
Creatinine, Ser: 0.72 mg/dL (ref 0.57–1.00)
GFR calc Af Amer: 123 mL/min/{1.73_m2} (ref >59)
GFR calc non Af Amer: 107 mL/min/{1.73_m2} (ref >59)
Globulin, Total: 2.6 g/dL (ref 1.5–4.5)
Glucose: 85 mg/dL (ref 65–99)
Potassium: 4.2 mmol/L (ref 3.5–5.2)
Sodium: 140 mmol/L (ref 134–144)
Total Protein: 6.6 g/dL (ref 6.0–8.5)

## 2019-10-08 LAB — LIPID PANEL
Chol/HDL Ratio: 2.6 ratio (ref 0.0–4.4)
Cholesterol, Total: 189 mg/dL (ref 100–199)
HDL: 73 mg/dL (ref >39)
LDL Chol Calc (NIH): 96 mg/dL (ref 0–99)
Triglycerides: 113 mg/dL (ref 0–149)
VLDL Cholesterol Cal: 20 mg/dL (ref 5–40)

## 2019-10-08 LAB — RPR: RPR Ser Ql: NONREACTIVE

## 2019-10-08 LAB — HEMOGLOBIN A1C
Est. average glucose Bld gHb Est-mCnc: 117 mg/dL
Hgb A1c MFr Bld: 5.7 % — ABNORMAL HIGH (ref 4.8–5.6)

## 2019-10-08 LAB — TSH: TSH: 1.72 u[IU]/mL (ref 0.450–4.500)

## 2019-10-08 NOTE — Telephone Encounter (Signed)
Mailbox is full, I was calling about labs

## 2019-10-11 ENCOUNTER — Telehealth: Payer: Self-pay | Admitting: Adult Health

## 2019-10-11 NOTE — Telephone Encounter (Signed)
Left message that labs were good, A1c slightly elevated, watch sweets and carbs

## 2019-10-12 LAB — CYTOLOGY - PAP
Adequacy: ABSENT
Chlamydia: NEGATIVE
Comment: NEGATIVE
Comment: NEGATIVE
Comment: NORMAL
Diagnosis: NEGATIVE
High risk HPV: NEGATIVE
Neisseria Gonorrhea: NEGATIVE

## 2020-02-21 ENCOUNTER — Emergency Department (HOSPITAL_COMMUNITY): Payer: Medicaid Other

## 2020-02-21 ENCOUNTER — Emergency Department (HOSPITAL_COMMUNITY)
Admission: EM | Admit: 2020-02-21 | Discharge: 2020-02-21 | Disposition: A | Discharge status: Home / Self Care | Payer: Medicaid Other | Attending: Emergency Medicine | Admitting: Emergency Medicine

## 2020-02-21 ENCOUNTER — Other Ambulatory Visit: Payer: Self-pay

## 2020-02-21 DIAGNOSIS — N8302 Follicular cyst of left ovary: Secondary | ICD-10-CM | POA: Insufficient documentation

## 2020-02-21 DIAGNOSIS — Z79899 Other long term (current) drug therapy: Secondary | ICD-10-CM | POA: Diagnosis not present

## 2020-02-21 DIAGNOSIS — R102 Pelvic and perineal pain: Secondary | ICD-10-CM | POA: Insufficient documentation

## 2020-02-21 DIAGNOSIS — R1032 Left lower quadrant pain: Secondary | ICD-10-CM | POA: Diagnosis present

## 2020-02-21 DIAGNOSIS — N83201 Unspecified ovarian cyst, right side: Secondary | ICD-10-CM | POA: Diagnosis not present

## 2020-02-21 DIAGNOSIS — Z87891 Personal history of nicotine dependence: Secondary | ICD-10-CM | POA: Diagnosis not present

## 2020-02-21 DIAGNOSIS — N83202 Unspecified ovarian cyst, left side: Secondary | ICD-10-CM | POA: Diagnosis not present

## 2020-02-21 LAB — CBC WITH DIFFERENTIAL/PLATELET
Abs Immature Granulocytes: 0.04 10*3/uL (ref 0.00–0.07)
Basophils Absolute: 0.1 10*3/uL (ref 0.0–0.1)
Basophils Relative: 1 %
Eosinophils Absolute: 0.1 10*3/uL (ref 0.0–0.5)
Eosinophils Relative: 1 %
HCT: 41.1 % (ref 36.0–46.0)
Hemoglobin: 13.4 g/dL (ref 12.0–15.0)
Immature Granulocytes: 0 %
Lymphocytes Relative: 29 %
Lymphs Abs: 3.3 10*3/uL (ref 0.7–4.0)
MCH: 30.8 pg (ref 26.0–34.0)
MCHC: 32.6 g/dL (ref 30.0–36.0)
MCV: 94.5 fL (ref 80.0–100.0)
Monocytes Absolute: 0.8 10*3/uL (ref 0.1–1.0)
Monocytes Relative: 7 %
Neutro Abs: 7.1 10*3/uL (ref 1.7–7.7)
Neutrophils Relative %: 62 %
Platelets: 304 10*3/uL (ref 150–400)
RBC: 4.35 MIL/uL (ref 3.87–5.11)
RDW: 13.2 % (ref 11.5–15.5)
WBC: 11.5 10*3/uL — ABNORMAL HIGH (ref 4.0–10.5)
nRBC: 0 % (ref 0.0–0.2)

## 2020-02-21 LAB — BASIC METABOLIC PANEL
Anion gap: 8 (ref 5–15)
BUN: 12 mg/dL (ref 6–20)
CO2: 27 mmol/L (ref 22–32)
Calcium: 8.9 mg/dL (ref 8.9–10.3)
Chloride: 102 mmol/L (ref 98–111)
Creatinine, Ser: 0.65 mg/dL (ref 0.44–1.00)
GFR calc Af Amer: >60 mL/min (ref >60)
GFR calc non Af Amer: >60 mL/min (ref >60)
Glucose, Bld: 92 mg/dL (ref 70–99)
Potassium: 3.8 mmol/L (ref 3.5–5.1)
Sodium: 137 mmol/L (ref 135–145)

## 2020-02-21 LAB — URINALYSIS, ROUTINE W REFLEX MICROSCOPIC
Bilirubin Urine: NEGATIVE
Glucose, UA: NEGATIVE mg/dL
Hgb urine dipstick: NEGATIVE
Ketones, ur: NEGATIVE mg/dL
Leukocytes,Ua: NEGATIVE
Nitrite: NEGATIVE
Protein, ur: NEGATIVE mg/dL
Specific Gravity, Urine: 1.019 (ref 1.005–1.030)
pH: 7 (ref 5.0–8.0)

## 2020-02-21 LAB — WET PREP, GENITAL
Clue Cells Wet Prep HPF POC: NONE SEEN
Sperm: NONE SEEN
Trich, Wet Prep: NONE SEEN
Yeast Wet Prep HPF POC: NONE SEEN

## 2020-02-21 LAB — PREGNANCY, URINE: Preg Test, Ur: NEGATIVE

## 2020-02-21 MED ORDER — HYDROCODONE-ACETAMINOPHEN 5-325 MG PO TABS: ORAL_TABLET | ORAL | 0 refills | Status: DC

## 2020-02-21 NOTE — ED Triage Notes (Signed)
Left lower quadrant pain usually associated with her period, states she had her period last week and is still having pain

## 2020-02-21 NOTE — ED Notes (Signed)
Pt transported to ultrasound.

## 2020-02-21 NOTE — Progress Notes (Signed)
CSW has reviewed patients chart and notes that patient is without primary care and/or insurance. CSW will follow up with patient regarding this matter and provide assistance as needed.   Rembert Browe M. Rael Tilly LCSWA Transitions of Care  Clinical Social Worker  Ph: [phone] 

## 2020-02-21 NOTE — Discharge Instructions (Addendum)
Continue taking ibuprofen, 800 mg 3 times a day with food.  You may apply warm heat off and on to your left side.  [facility] to [facility]up appointment.  Return to the emergency department if you develop any worsening symptoms.

## 2020-02-21 NOTE — ED Provider Notes (Signed)
[facility] EMERGENCY DEPARTMENT Provider Note   CSN: [id number] Arrival date & time: 02/21/20  1114     History Chief Complaint  Patient presents with  . Abdominal Pain    Amanda Mccoy is a 39 y.o. female.  HPI      Amanda Mccoy is a 39 y.o. female who presents to the Emergency Department complaining of left lower abdominal pain for one week.  She describes the pain as constant and dull, non radiating.  Onset during menses, but cycle has stopped and she continues to have pain.  Reports recurrent left lower abdominal pain, but pain typically resolves after her periods stop.  She denies dysuria, flank pain, abnormal vaginal bleeding, vaginal discharge.  No fever, chills, nausea, vomiting or bowel changes.  Nothing makes the pain better or worse.    Past Medical History:  Diagnosis Date  . Gestational diabetes    1st pregnancy    Patient Active Problem List   Diagnosis Date Noted  . Screening for diabetes mellitus 10/07/2019  . Screening examination for STD (sexually transmitted disease) 10/07/2019  . Encounter for gynecological examination with Papanicolaou smear of cervix 10/07/2019  . Encounter for surveillance of contraceptive pills 09/22/2019  . Routine gynecological examination 06/08/2014  . 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 NO PAST SURGERIES       OB History    Gravida  6   Para  6   Term  4   Preterm  2   AB      Living  5     SAB      TAB      Ectopic      Multiple      Live Births  6           Family History  Problem Relation Age of Onset  . Hyperlipidemia Mother   . Hyperlipidemia Father   . Emphysema Maternal Grandmother   . Cancer Maternal Grandfather        colon  . Cancer Paternal Grandfather        prostate  . Hypertension Other   . Stroke Other   . Cancer Other        prostate, colon, cervical  . Heart attack Other   . Coronary artery disease Other     Social  History   Tobacco Use  . Smoking status: Former Smoker    Types: Cigarettes  . Smokeless tobacco: Never Used  Substance Use Topics  . Alcohol use: No  . Drug use: No    Types: Marijuana    Comment: not since high school.    Home Medications Prior to Admission medications   Medication Sig Start Date End Date Taking? Authorizing Provider  Multiple Vitamin (MULTIVITAMIN) tablet Take 1 tablet by mouth daily.    [provider]  Norgestimate-Ethinyl Estradiol Triphasic (TRI-LO-SPRINTEC) 0.18/0.215/0.25 MG-25 MCG tab Take 1 tablet by mouth daily. 09/22/19   Adline Potter, NP    Allergies    Patient has no known allergies.  Review of Systems   Review of Systems  Constitutional: Negative for appetite change, chills and fever.  Respiratory: Negative for shortness of breath.   Cardiovascular: Negative for chest pain.  Gastrointestinal: Positive for abdominal pain. Negative for blood in stool, diarrhea, nausea and vomiting.  Genitourinary: Negative for decreased urine volume, difficulty urinating, dysuria, flank pain, menstrual problem, vaginal bleeding, vaginal discharge and vaginal pain.  Musculoskeletal: Negative for back  pain.  Skin: Negative for color change and rash.  Neurological: Negative for dizziness, weakness and numbness.  Hematological: Negative for adenopathy.    Physical Exam Updated Vital Signs BP 130/82   Pulse 67   Temp 98.2 F (36.8 C) (Oral)   Resp 16   Ht 5\' 6"  (1.676 m)   Wt 92 kg   LMP 02/08/2020   SpO2 98%   BMI 32.74 kg/m   Physical Exam Vitals and nursing note reviewed. Exam conducted with a chaperone present.  Constitutional:      Appearance: Normal appearance. She is well-developed. She is not ill-appearing or toxic-appearing.  HENT:     Mouth/Throat:     Mouth: Mucous membranes are moist.  Cardiovascular:     Rate and Rhythm: Normal rate and regular rhythm.  Pulmonary:     Effort: Pulmonary effort is normal.     Breath  sounds: Normal breath sounds.  Chest:     Chest wall: No tenderness.  Abdominal:     General: There is no distension.     Palpations: Abdomen is soft. There is no mass.     Tenderness: There is abdominal tenderness in the left lower quadrant. There is no right CVA tenderness or left CVA tenderness.     Comments: Some ttp of the LLQ/pelvis.  No rebound tenderness or guarding.  abd is soft.    Genitourinary:    Cervix: No cervical motion tenderness, friability or cervical bleeding.     Uterus: Normal.      Adnexa:        Right: No mass or tenderness.         Left: Tenderness present. No mass or fullness.       Comments: Bimanual exam performed,  No palpable adnexal tenderness, masses or enlargement.  No CMT.  No vaginal discharge or vaginal bleeding.   Musculoskeletal:        General: Normal range of motion.  Skin:    General: Skin is warm.     Findings: No rash.  Neurological:     General: No focal deficit present.     Mental Status: She is alert.     Sensory: No sensory deficit.     Motor: No weakness.     ED Results / Procedures / Treatments   Labs (all labs ordered are listed, but only abnormal results are displayed) Labs Reviewed  WET PREP, GENITAL - Abnormal; Notable for the following components:      Result Value   WBC, Wet Prep HPF POC FEW (*)    All other components within normal limits  URINALYSIS, ROUTINE W REFLEX MICROSCOPIC - Abnormal; Notable for the following components:   APPearance HAZY (*)    All other components within normal limits  CBC WITH DIFFERENTIAL/PLATELET - Abnormal; Notable for the following components:   WBC 11.5 (*)    All other components within normal limits  PREGNANCY, URINE  BASIC METABOLIC PANEL  GC/CHLAMYDIA PROBE AMP ([place]) NOT AT [facility]    EKG None  Radiology US PELVIC COMPLETE W TRANSVAGINAL AND TORSION R/O  Result Date: 02/21/2020 CLINICAL DATA:  Left pelvic pain for the past week. Taking birth control pills. Negative  urine pregnancy test. EXAM: TRANSABDOMINAL AND TRANSVAGINAL ULTRASOUND OF PELVIS DOPPLER ULTRASOUND OF OVARIES TECHNIQUE: Both transabdominal and transvaginal ultrasound examinations of the pelvis were performed. Transabdominal technique was performed for global imaging of the pelvis including uterus, ovaries, adnexal regions, and pelvic cul-de-sac. It was necessary to proceed with endovaginal  exam following the transabdominal exam to visualize the ovaries and better visualize the uterus and endometrium. Color and duplex Doppler ultrasound was utilized to evaluate blood flow to the ovaries. COMPARISON:  None. FINDINGS: Uterus Measurements: 8.8 x 4.4 x 4.1 cm = volume: 75.5 mL. No fibroids or other mass visualized. Endometrium Thickness: 6.8 mm.  No focal abnormality visualized. Right ovary Measurements: 6.3 x 5.4 x 5.3 cm = volume: 93.4 mL. 4.7 cm simple cyst. Left ovary Measurements: 6.8 x 5.5 x 4.6 cm = volume: 91.3 mL. 5.7 cm simple cyst. Pulsed Doppler evaluation of both ovaries demonstrates normal low-resistance arterial and venous waveforms. Other findings No abnormal free fluid. IMPRESSION: 1. 5.7 cm simple left ovarian cyst and 4.7 cm simple right ovarian cyst. These are almost certainly benign, but follow up ultrasound is recommended in 1 year according to the Society of Radiologists in Ultrasound 2010 consensus Conference Statement (D Lenis Noon et al. Management of Asymptomatic Ovarian and Other Adnexal Cysts Imaged at [place]: Society of Radiologists in Ultrasound Consensus Conference Statement 2010. Radiology 256 (Sept 2010): 943-954.). 2. Otherwise, normal examination. Electronically Signed   By: Beckie Salts M.D.   On: 02/21/2020 14:06    Procedures Procedures (including critical care time)  Medications Ordered in ED Medications - No data to display  ED Course  I have reviewed the triage vital signs and the nursing notes.  Pertinent labs & imaging results that were available during my care of the  patient were reviewed by me and considered in my medical decision making (see chart for details).  Clinical Course as of Feb 22 2013  Wed Feb 23, 2020  1952 CBC with Differential(!) [TT]    Clinical Course User Index [TT] Pauline Aus, PA-C   MDM Rules/Calculators/A&P                      Patient with pain of the left LLQ/pelvis for one week, developed after menses.  Ultrasound shows normal blood flow to the ovaries and a nearly 6 mm likely simple cyst of the left ovary and a smaller cyst to the right ovary as well.  Labs and urinalysis are reassuring.  No fever, leukocytosis, dysuria or flank pain.  Doubt acute abdominal process.  Pt is well appearing and LLQ pain seems minimal.  Considered TOA,but felt less likely given [place] results of simple cyst, duration of symptoms, and minimal exam findings. No US findings to suggest ovarian torsion.  Preg test is negative.  Patient appears appropriate for discharge home, I will recommend continue with applications of heat, NSAIDs and short course of pain medication.  [facility].  GC and chlamydia results pending.  Strict return precautions discussed.   Final Clinical Impression(s) / ED Diagnoses Final diagnoses:  Pelvic pain  Pelvic pain in female  Cysts of both ovaries    Rx / DC Orders ED Discharge Orders    None       Rosey Bath 02/23/20 2015    Raeford Razor, MD 02/25/20 1013

## 2020-02-22 LAB — GC/CHLAMYDIA PROBE AMP (~~LOC~~) NOT AT ARMC
Chlamydia: NEGATIVE
Comment: NEGATIVE
Comment: NORMAL
Neisseria Gonorrhea: NEGATIVE

## 2020-07-31 ENCOUNTER — Other Ambulatory Visit: Payer: Self-pay

## 2020-07-31 ENCOUNTER — Ambulatory Visit (INDEPENDENT_AMBULATORY_CARE_PROVIDER_SITE_OTHER): Payer: Medicaid Other | Admitting: Advanced Practice Midwife

## 2020-07-31 ENCOUNTER — Telehealth: Payer: Self-pay

## 2020-07-31 ENCOUNTER — Encounter: Payer: Self-pay | Admitting: Advanced Practice Midwife

## 2020-07-31 VITALS — BP 120/75 | HR 83 | Ht 66.0 in | Wt 198.0 lb

## 2020-07-31 DIAGNOSIS — N83202 Unspecified ovarian cyst, left side: Secondary | ICD-10-CM | POA: Diagnosis not present

## 2020-07-31 DIAGNOSIS — N83201 Unspecified ovarian cyst, right side: Secondary | ICD-10-CM

## 2020-07-31 DIAGNOSIS — R102 Pelvic and perineal pain: Secondary | ICD-10-CM

## 2020-07-31 MED ORDER — NAPROXEN 500 MG PO TABS: 500.0000 mg | ORAL_TABLET | Freq: Two times a day (BID) | ORAL | 1 refills | Status: DC

## 2020-07-31 NOTE — Progress Notes (Signed)
[facility] Clinic Visit  Patient name: Amanda Mccoy MRN [id number]  Date of birth: [birth year]  CC & HPI:  Amanda Mccoy is a 39 y.o.  female presenting today for (mainly)LLQ pain.  She had [place] in April which showed:   IMPRESSION: 1. 5.7 cm simple left ovarian cyst and 4.7 cm simple right ovarian cyst. These are almost certainly benign, but follow up ultrasound is recommended in 1 year   Usually only painful during menses.  Occ RLQ pain, but mainly left sided now.  However, pain has persisted beyond period for another week or so, ready to be done with the pain.   Pertinent History Reviewed:  Medical & Surgical Hx:   Past Medical History:  Diagnosis Date  . Gestational diabetes    1st pregnancy   Past Surgical History:  Procedure Laterality Date  . NO PAST SURGERIES     Family History  Problem Relation Age of Onset  . Hyperlipidemia Mother   . Hyperlipidemia Father   . Emphysema Maternal Grandmother   . Cancer Maternal Grandfather        colon  . Cancer Paternal Grandfather        prostate  . Hypertension Other   . Stroke Other   . Cancer Other        prostate, colon, cervical  . Heart attack Other   . Coronary artery disease Other     Current Outpatient Medications:  Marland Kitchen  Multiple Vitamin (MULTIVITAMIN) tablet, Take 1 tablet by mouth daily., Disp: , Rfl:  .  Norgestimate-Ethinyl Estradiol Triphasic (TRI-LO-SPRINTEC) 0.18/0.215/0.25 MG-25 MCG tab, Take 1 tablet by mouth daily., Disp: 84 tablet, Rfl: 4 .  HYDROcodone-acetaminophen (NORCO/VICODIN) 5-325 MG tablet, Take one tab po q 4 hrs prn pain (Patient not taking: Reported on 07/31/2020), Disp: 8 tablet, Rfl: 0 .  naproxen (NAPROSYN) 500 MG tablet, Take 1 tablet (500 mg total) by mouth 2 (two) times daily with a meal., Disp: 30 tablet, Rfl: 1 Social History: Reviewed -  reports that she has quit smoking. Her smoking use included cigarettes. She has never used smokeless tobacco.  Review of Systems:   Constitutional:  Negative for fever and chills Eyes: Negative for visual disturbances Respiratory: Negative for shortness of breath, dyspnea Cardiovascular: Negative for chest pain or palpitations  Gastrointestinal: Negative for vomiting, diarrhea and constipation; no abdominal pain Genitourinary: Negative for dysuria and urgency, vaginal irritation or itching Musculoskeletal: Negative for back pain, joint pain, myalgias  Neurological: Negative for dizziness and headaches    Objective Findings:    Physical Examination: Vitals:   07/31/20 1150  BP: 120/75  Pulse: 83   General appearance - well appearing, and in no distress Mental status - alert, oriented to person, place, and time Chest:  Normal respiratory effort Heart - normal rate and regular rhythm Abdomen:  Soft, nontender Pelvic: Tender to bimanual, L>R Musculoskeletal:  Normal range of motion without pain Extremities:  No edema    No results found for this or any previous visit (from the past 24 hour(s)).    Assessment & Plan:  A:   Probably pain from ov cyst P:  R/O infection     Return for asap for pelvic GYN [place] and visit w/any surgeon after.  Jacklyn Shell CNM 07/31/2020 12:14 PM

## 2020-07-31 NOTE — Telephone Encounter (Signed)
Telephoned patient at home number and advised patient Dr. Despina Hidden would be go over results that day. Did not think there would be an exam. Patient voiced understanding.

## 2020-07-31 NOTE — Telephone Encounter (Signed)
Pt is scheduled on 08/28/20 to see Amanda Mccoy on this day after her imaging appt. And is wondering if there will be a physical exam done during this appt. And if so she would prefer to see a female provider for this visit. If no physical exam is needed then she doesn't mind seeing Amanda Mccoy. Please advise so I can schedule accordingly. Thanks

## 2020-08-25 ENCOUNTER — Other Ambulatory Visit: Payer: Self-pay | Admitting: Advanced Practice Midwife

## 2020-08-28 ENCOUNTER — Ambulatory Visit: Payer: Medicaid Other | Admitting: Obstetrics & Gynecology

## 2020-08-28 ENCOUNTER — Ambulatory Visit (INDEPENDENT_AMBULATORY_CARE_PROVIDER_SITE_OTHER): Payer: Medicaid Other

## 2020-08-28 DIAGNOSIS — N83201 Unspecified ovarian cyst, right side: Secondary | ICD-10-CM

## 2020-08-28 DIAGNOSIS — N83202 Unspecified ovarian cyst, left side: Secondary | ICD-10-CM

## 2020-08-28 NOTE — Progress Notes (Signed)
PELVIC US TA/TV: homogenous anteverted uterus,wnl,EEC 5.9 cm,normal left ovary,2.1 x 2 x 1.1 cm hemorrhagic right ovarian cyst,ovaries appear mobile,no free fluid,no pain during ultrasound  Chaperone Joni Reining

## 2020-08-31 ENCOUNTER — Other Ambulatory Visit: Payer: Self-pay

## 2020-08-31 ENCOUNTER — Encounter: Payer: Self-pay | Admitting: Obstetrics & Gynecology

## 2020-08-31 ENCOUNTER — Ambulatory Visit (INDEPENDENT_AMBULATORY_CARE_PROVIDER_SITE_OTHER): Payer: Medicaid Other | Admitting: Obstetrics & Gynecology

## 2020-08-31 VITALS — BP 132/79 | HR 69 | Ht 66.0 in | Wt 198.0 lb

## 2020-08-31 DIAGNOSIS — N83201 Unspecified ovarian cyst, right side: Secondary | ICD-10-CM

## 2020-08-31 DIAGNOSIS — N83202 Unspecified ovarian cyst, left side: Secondary | ICD-10-CM | POA: Diagnosis not present

## 2020-08-31 MED ORDER — DESOGESTREL-ETHINYL ESTRADIOL 0.15-30 MG-MCG PO TABS: 1.0000 | ORAL_TABLET | Freq: Every day | ORAL | 3 refills | Status: DC

## 2020-08-31 NOTE — Progress Notes (Signed)
Follow up appointment for results  Chief Complaint  Patient presents with  . discuss [place] results    Blood pressure 132/79, pulse 69, height 5\' 6"  (1.676 m), weight 198 lb (89.8 kg), last menstrual period 08/09/2020.  GYNECOLOGIC SONOGRAM   Amanda Mccoy is a 39 y.o. 24 LMP 08/09/2020 She is here for a pelvic sonogram to f/u bilat ovarian cysts.  Uterus                      6.4 x 4.6 x 6 cm, Total uterine volume 91 cc,homogenous anteverted uterus,wnl  Endometrium          5.9 mm, symmetrical, wnl  Right ovary             3 x 2.1 x 2.8 cm, 2.1 x 2 x 1.1 cm hemorrhagic right ovarian cyst  Left ovary                2.4 x 3 x 1.9 cm, wnl  No free fluid   Technician Comments:  PELVIC 10/09/2020 TA/TV: homogenous anteverted uterus,wnl,EEC 5.9 cm,normal left ovary,2.1 x 2 x 1.1 cm hemorrhagic right ovarian cyst,ovaries appear mobile,no free fluid,no pain during ultrasound  Chaperone [place] 08/28/2020 11:02 AM  Clinical Impression and recommendations:  I have reviewed the sonogram results above, combined with the patient's current clinical course, below are my impressions and any appropriate recommendations for management based on the sonographic findings.  Uterus is normal size shape and contour Endometrium is normal Left ovary is normal Right ovary with a physiologic post ovulatory follicular cyst in asecretory phase of cycle  Normal sonogram with physiologic cyst   08/30/2020 08/28/2020 11:11 AM     MEDS ordered this encounter: Meds ordered this encounter  Medications  . desogestrel-ethinyl estradiol (APRI) 0.15-30 MG-MCG tablet    Sig: Take 1 tablet by mouth daily.    Dispense:  84 tablet    Refill:  3    Orders for this encounter: No orders of the defined types were placed in this encounter.   Impression: Resolved ovarian cysts  Plan: Switch to a monophasic pill  Follow Up: Return if symptoms worsen or fail  to improve.       Face to face time:  20 minutes  Greater than 50% of the visit time was spent in counseling and coordination of care with the patient.  The summary and outline of the counseling and care coordination is summarized in the note above.   All questions were answered.  Past Medical History:  Diagnosis Date  . Gestational diabetes    1st pregnancy    Past Surgical History:  Procedure Laterality Date  . NO PAST SURGERIES      OB History    Gravida  6   Para  6   Term  4   Preterm  2   AB      Living  5     SAB      TAB      Ectopic      Multiple      Live Births  6           No Known Allergies  Social History   Socioeconomic History  . Marital status: Significant Other    Spouse name: Not on file  . Number of children: Not on file  . Years of education: Not on file  . Highest education level: Not on  file  Occupational History  . Not on file  Tobacco Use  . Smoking status: Former Smoker    Types: Cigarettes  . Smokeless tobacco: Never Used  Vaping Use  . Vaping Use: Never used  Substance and Sexual Activity  . Alcohol use: No  . Drug use: No    Types: Marijuana    Comment: not since high school.  . Sexual activity: Yes    Birth control/protection: Pill  Other Topics Concern  . Not on file  Social History Narrative  . Not on file   Social Determinants of Health   Financial Resource Strain:   . Difficulty of Paying Living Expenses: Not on file  Food Insecurity:   . Worried About Programme researcher, broadcasting/film/video in the Last Year: Not on file  . Ran Out of Food in the Last Year: Not on file  Transportation Needs:   . Lack of Transportation (Medical): Not on file  . Lack of Transportation (Non-Medical): Not on file  Physical Activity:   . Days of Exercise per Week: Not on file  . Minutes of Exercise per Session: Not on file  Stress:   . Feeling of Stress : Not on file  Social Connections:   . Frequency of Communication with  Friends and Family: Not on file  . Frequency of Social Gatherings with Friends and Family: Not on file  . Attends Religious Services: Not on file  . Active Member of Clubs or Organizations: Not on file  . Attends Banker Meetings: Not on file  . Marital Status: Not on file    Family History  Problem Relation Age of Onset  . Hyperlipidemia Mother   . Hyperlipidemia Father   . Emphysema Maternal Grandmother   . Cancer Maternal Grandfather        colon  . Cancer Paternal Grandfather        prostate  . Hypertension Other   . Stroke Other   . Cancer Other        prostate, colon, cervical  . Heart attack Other   . Coronary artery disease Other

## 2020-11-02 ENCOUNTER — Other Ambulatory Visit: Payer: Self-pay | Admitting: Adult Health

## 2021-06-13 ENCOUNTER — Other Ambulatory Visit (HOSPITAL_COMMUNITY): Payer: Self-pay | Admitting: Adult Health

## 2021-06-13 ENCOUNTER — Ambulatory Visit (INDEPENDENT_AMBULATORY_CARE_PROVIDER_SITE_OTHER): Payer: Medicaid Other | Admitting: Adult Health

## 2021-06-13 ENCOUNTER — Other Ambulatory Visit: Payer: Self-pay

## 2021-06-13 ENCOUNTER — Encounter: Payer: Self-pay | Admitting: Adult Health

## 2021-06-13 VITALS — BP 137/85 | HR 86 | Ht 65.0 in | Wt 206.0 lb

## 2021-06-13 DIAGNOSIS — Z01419 Encounter for gynecological examination (general) (routine) without abnormal findings: Secondary | ICD-10-CM

## 2021-06-13 DIAGNOSIS — Z3041 Encounter for surveillance of contraceptive pills: Secondary | ICD-10-CM

## 2021-06-13 DIAGNOSIS — Z1231 Encounter for screening mammogram for malignant neoplasm of breast: Secondary | ICD-10-CM

## 2021-06-13 DIAGNOSIS — Z1211 Encounter for screening for malignant neoplasm of colon: Secondary | ICD-10-CM | POA: Diagnosis not present

## 2021-06-13 LAB — HEMOCCULT GUIAC POC 1CARD (OFFICE): Fecal Occult Blood, POC: NEGATIVE

## 2021-06-13 MED ORDER — DESOGESTREL-ETHINYL ESTRADIOL 0.15-30 MG-MCG PO TABS: 1.0000 | ORAL_TABLET | Freq: Every day | ORAL | 3 refills | Status: DC

## 2021-06-13 NOTE — Progress Notes (Signed)
Patient ID: Amanda Mccoy, female   DOB: March 01, 1981, 40 y.o.   MRN: [id number] History of Present Illness: Amanda Mccoy is a 40 year old black female, with SO. [id number] in for a well woman gyn exam. Lab Results  Component Value Date   DIAGPAP  10/07/2019    - Negative for intraepithelial lesion or malignancy (NILM)   HPV NOT DETECTED 10/21/2017   HPVHIGH Negative 10/07/2019      Current Medications, Allergies, Past Medical History, Past Surgical History, Family History and Social History were reviewed in [name] record.     Review of Systems:  Patient denies any headaches, hearing loss, fatigue, blurred vision, shortness of breath, chest pain, abdominal pain, problems with bowel movements, urination, or intercourse. No joint pain or mood swings.  Not sleeping well,trying melatonin   Physical Exam:BP 137/85 (BP Location: Left Arm, Patient Position: Sitting, Cuff Size: Large)   Pulse 86   Ht 5\' 5"  (1.651 m)   Wt 206 lb (93.4 kg)   LMP 05/17/2021 (Approximate)   BMI 34.28 kg/m   General:  Well developed, well nourished, no acute distress Skin:  Warm and dry Neck:  Midline trachea, normal thyroid, good ROM, no lymphadenopathy Lungs; Clear to auscultation bilaterally Breast:  No dominant palpable mass, retraction, or nipple discharge Cardiovascular: Regular rate and rhythm Abdomen:  Soft, non tender, no hepatosplenomegaly Pelvic:  External genitalia is normal in appearance, no lesions.  The vagina is normal in appearance. Urethra has no lesions or masses. The cervix is bulbous.  Uterus is felt to be normal size, shape, and contour.  No adnexal masses or tenderness noted.Bladder is non tender, no masses felt. Rectal: Good sphincter tone, no polyps, or hemorrhoids felt.  Hemoccult negative. Extremities/musculoskeletal:  No swelling or varicosities noted, no clubbing or cyanosis Psych:  No mood changes, alert and cooperative,seems happy AA is 1 Fall risk is  low Depression screen [facility] 2/9 06/13/2021 10/07/2019 10/21/2017  Decreased Interest 0 0 0  Down, Depressed, Hopeless 0 1 0  PHQ - 2 Score 0 1 0  Altered sleeping 2 - -  Tired, decreased energy 1 - -  Change in appetite 0 - -  Feeling bad or failure about yourself  0 - -  Trouble concentrating 0 - -  Moving slowly or fidgety/restless 0 - -  Suicidal thoughts 0 - -  PHQ-9 Score 3 - -    GAD 7 : Generalized Anxiety Score 06/13/2021  Nervous, Anxious, on Edge 0  Control/stop worrying 0  Worry too much - different things 1  Trouble relaxing 0  Restless 0  Easily annoyed or irritable 0  Afraid - awful might happen 0  Total GAD 7 Score 1      Upstream - 06/13/21 1340       Pregnancy Intention Screening   Does the patient want to become pregnant in the next year? No    Does the patient's partner want to become pregnant in the next year? No    Would the patient like to discuss contraceptive options today? No      Contraception Wrap Up   Current Method Oral Contraceptive    End Method Oral Contraceptive    Contraception Counseling Provided No            Examination chaperoned by 08/13/21 LPN   Impression and Plan:  1. Encounter for well woman exam with routine gynecological exam Pap and physical in 1 year,with fasting labs Colonoscopy at 45  2.  Encounter for screening fecal occult blood testing   3. Encounter for surveillance of contraceptive pills Refilled OCs Meds ordered this encounter  Medications   desogestrel-ethinyl estradiol (APRI) 0.15-30 MG-MCG tablet    Sig: Take 1 tablet by mouth daily.    Dispense:  84 tablet    Refill:  3    Order Specific Question:   Supervising Provider    Answer:   Despina Hidden, LUTHER H [[id number]]     4. Screening mammogram for breast cancer First mammogram scheduled for her 06/21/21 at 8:45 am at [facility]

## 2021-06-21 ENCOUNTER — Ambulatory Visit (HOSPITAL_COMMUNITY)
Admission: RE | Admit: 2021-06-21 | Discharge: 2021-06-21 | Disposition: A | Discharge status: Home / Self Care | Payer: Medicaid Other | Source: Ambulatory Visit | Attending: Adult Health | Admitting: Adult Health

## 2021-06-21 ENCOUNTER — Other Ambulatory Visit: Payer: Self-pay

## 2021-06-21 DIAGNOSIS — Z1231 Encounter for screening mammogram for malignant neoplasm of breast: Secondary | ICD-10-CM | POA: Insufficient documentation

## 2021-07-23 ENCOUNTER — Other Ambulatory Visit: Payer: Self-pay | Admitting: Obstetrics & Gynecology

## 2021-10-11 IMAGING — MG MM DIGITAL SCREENING BILAT W/ TOMO AND CAD
8 of 13 series · 8 of 37 positions shown · non-contrast
Comparison: None.

CLINICAL DATA: Screening.

EXAM:
DIGITAL SCREENING BILATERAL MAMMOGRAM WITH TOMOSYNTHESIS AND CAD
TECHNIQUE: Bilateral screening digital craniocaudal and mediolateral oblique
mammograms were obtained. Bilateral screening digital breast
tomosynthesis was performed. The images were evaluated with
computer-aided detection.

[R MLO]
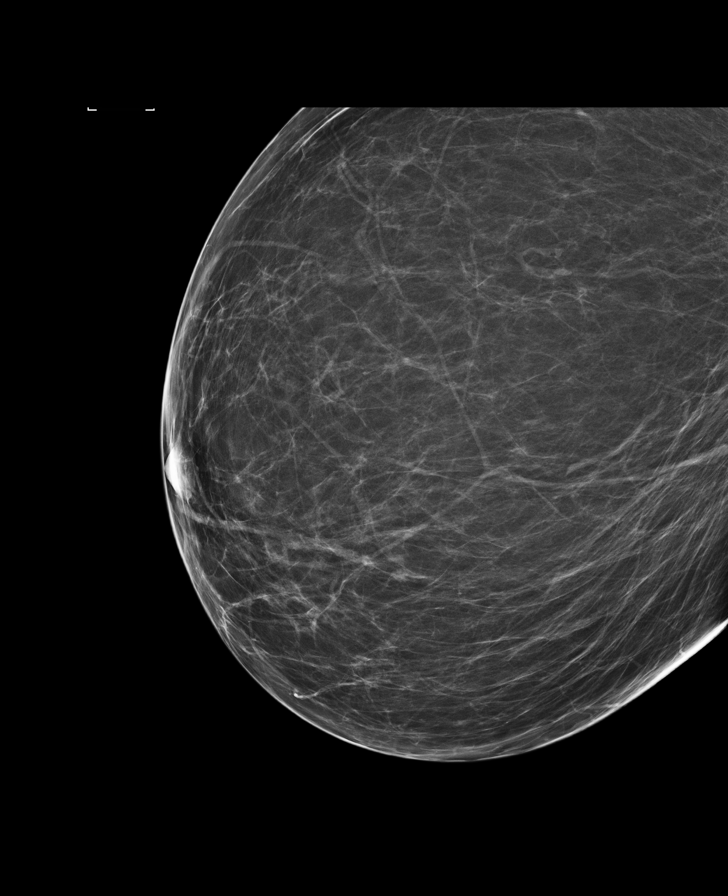

[L MLO synth-2D (1 of 2)]
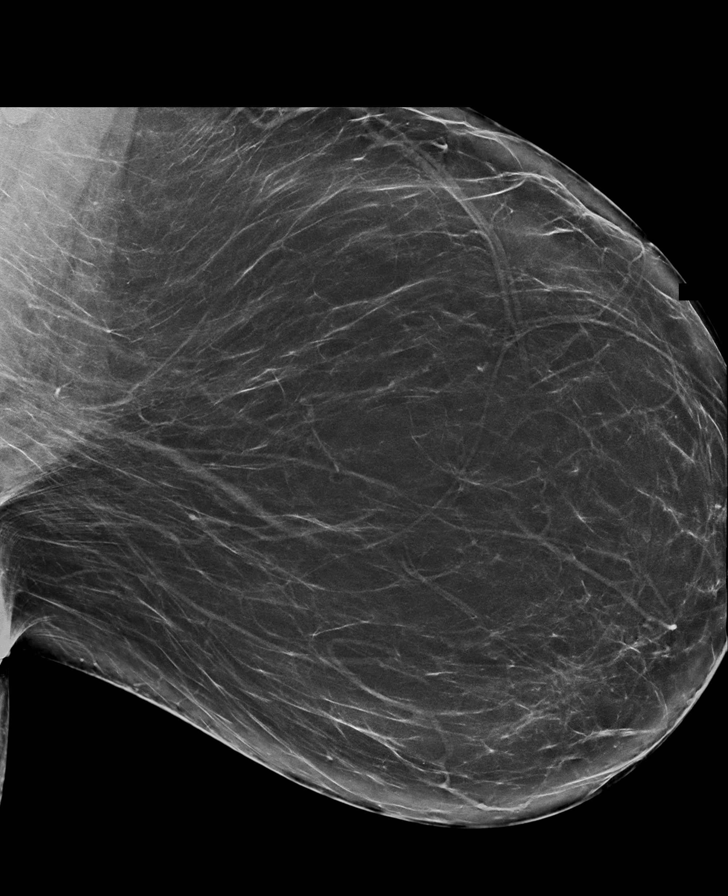

[L MLO synth-2D (2 of 2)]
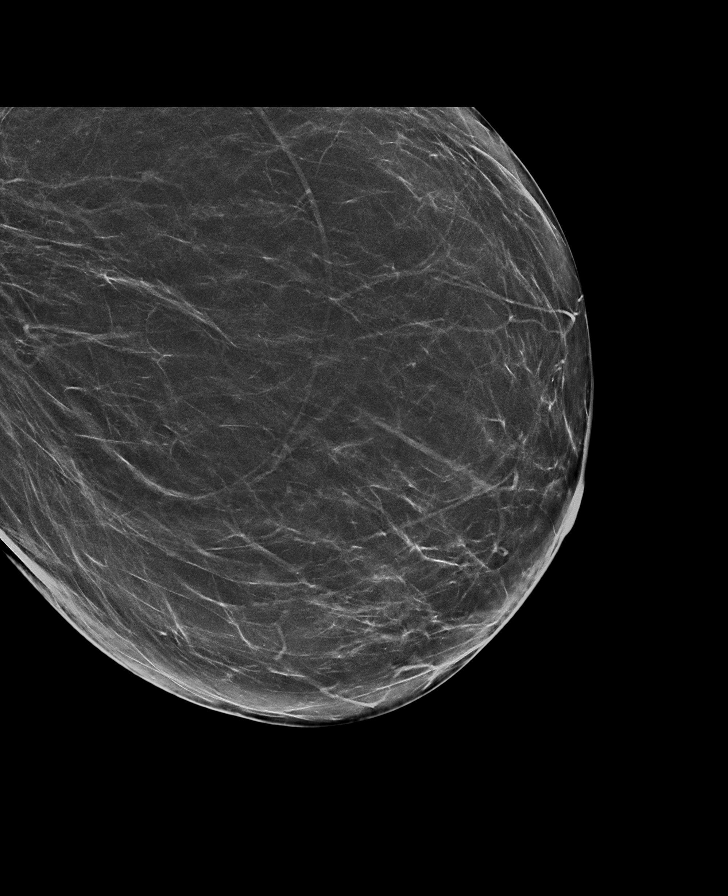

[L CC synth-2D (1 of 2)]
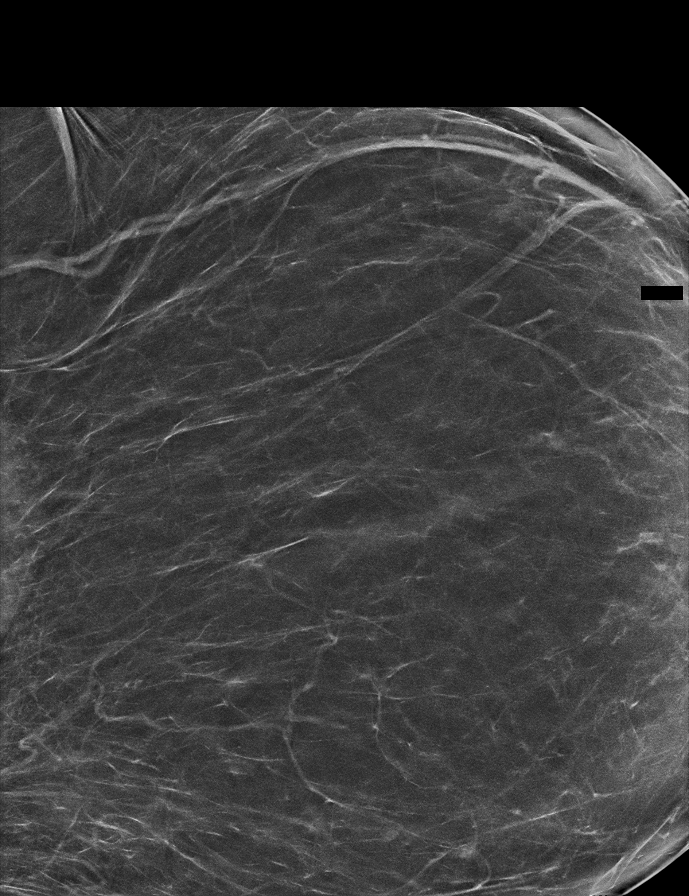

[R MLO synth-2D]
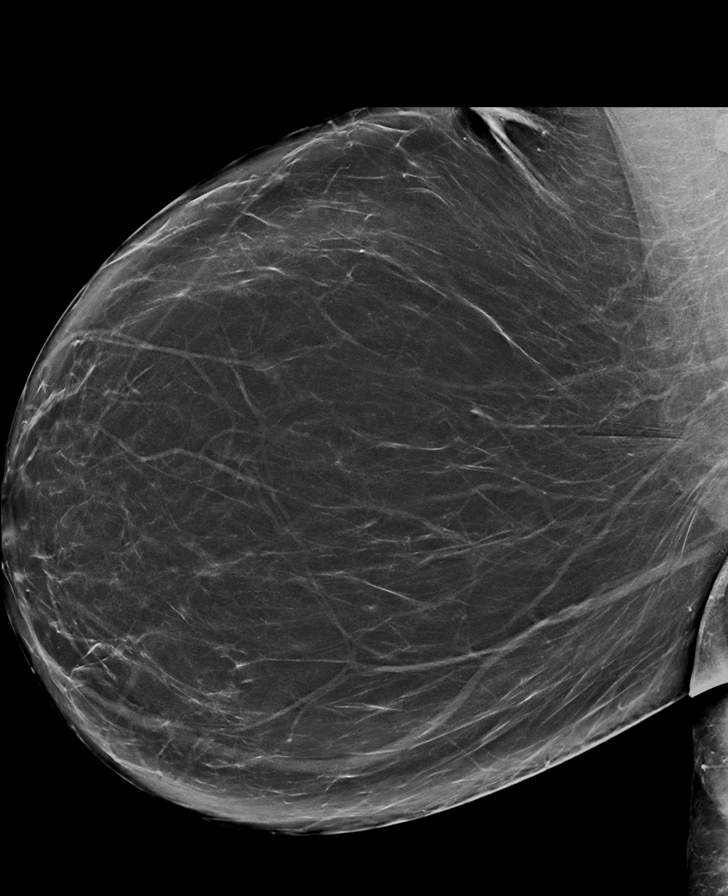

[R CC synth-2D]
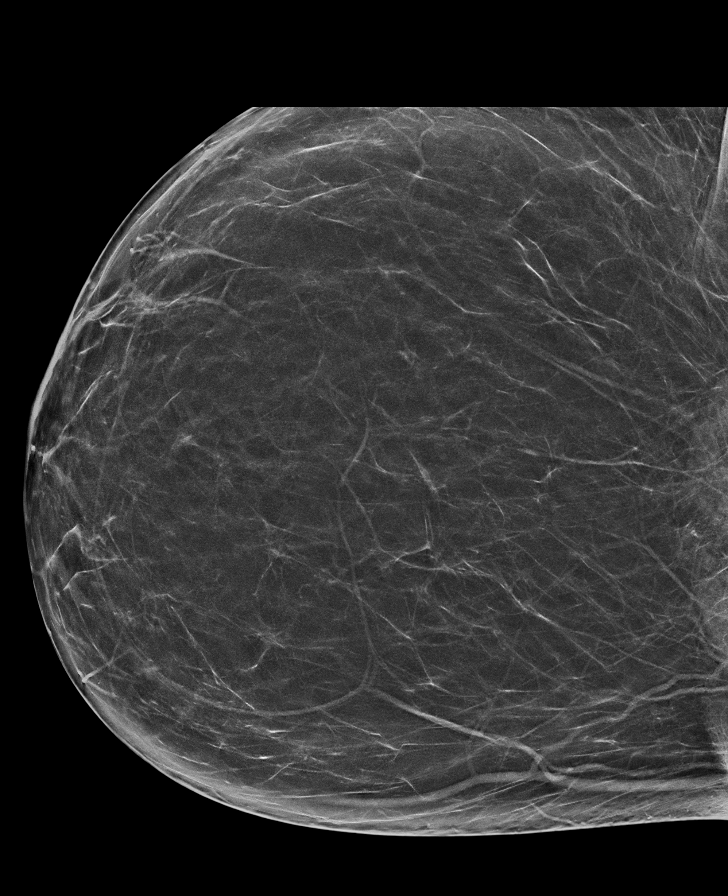

[L CC synth-2D (2 of 2)]
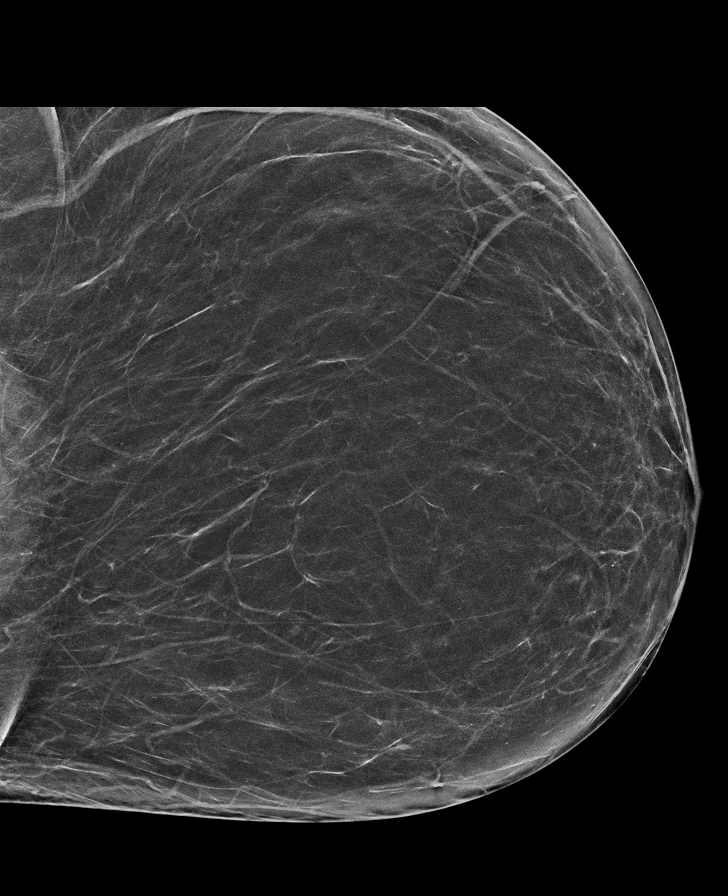

[L CC tomo · tomo slice 37/74.0]
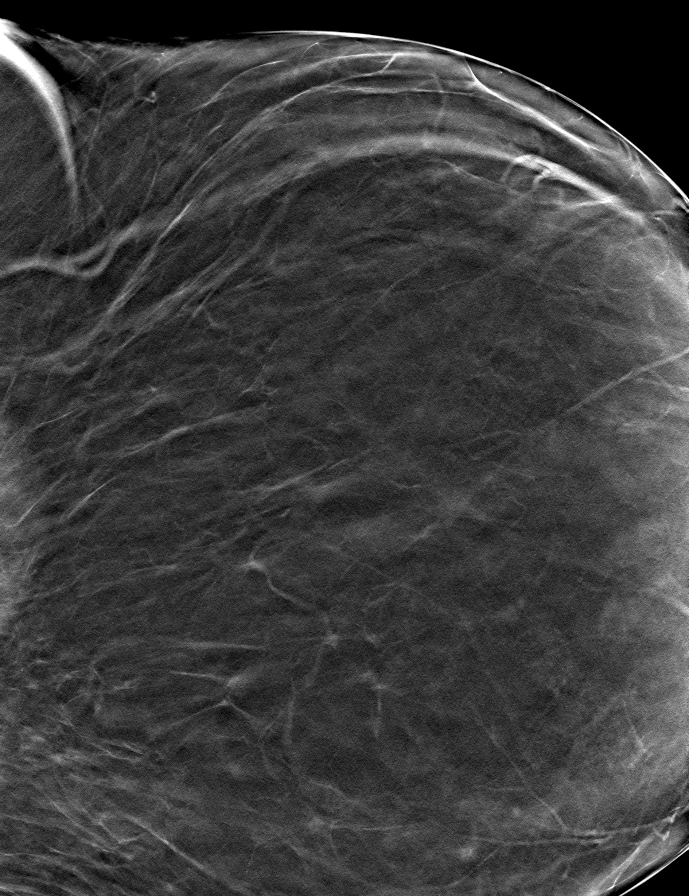

[8 of 37 positions shown; findings below may reference images not displayed]

ACR Breast Density Category b: There are scattered areas of
fibroglandular density.
FINDINGS: There are no findings suspicious for malignancy.
IMPRESSION: No mammographic evidence of malignancy. A result letter of this
screening mammogram will be mailed directly to the patient.

RECOMMENDATION:
Screening mammogram in one year. (Code:XG-X-X7B)

BI-RADS CATEGORY  1: Negative.

## 2021-11-07 DIAGNOSIS — H5213 Myopia, bilateral: Secondary | ICD-10-CM | POA: Diagnosis not present

## 2021-11-15 ENCOUNTER — Other Ambulatory Visit: Payer: Self-pay | Admitting: Advanced Practice Midwife

## 2021-12-12 DIAGNOSIS — H5213 Myopia, bilateral: Secondary | ICD-10-CM | POA: Diagnosis not present

## 2021-12-12 DIAGNOSIS — H52223 Regular astigmatism, bilateral: Secondary | ICD-10-CM | POA: Diagnosis not present

## 2022-01-25 ENCOUNTER — Other Ambulatory Visit: Payer: Self-pay | Admitting: Advanced Practice Midwife

## 2022-07-04 ENCOUNTER — Other Ambulatory Visit (HOSPITAL_COMMUNITY)
Admission: RE | Admit: 2022-07-04 | Discharge: 2022-07-04 | Disposition: A | Discharge status: Home / Self Care | Payer: Medicaid Other | Source: Ambulatory Visit | Attending: Adult Health | Admitting: Adult Health

## 2022-07-04 ENCOUNTER — Encounter: Payer: Self-pay | Admitting: Adult Health

## 2022-07-04 ENCOUNTER — Ambulatory Visit (INDEPENDENT_AMBULATORY_CARE_PROVIDER_SITE_OTHER): Payer: Medicaid Other | Admitting: Adult Health

## 2022-07-04 VITALS — BP 116/73 | HR 56 | Ht 65.5 in | Wt 195.0 lb

## 2022-07-04 DIAGNOSIS — R7309 Other abnormal glucose: Secondary | ICD-10-CM | POA: Diagnosis not present

## 2022-07-04 DIAGNOSIS — Z1211 Encounter for screening for malignant neoplasm of colon: Secondary | ICD-10-CM | POA: Diagnosis not present

## 2022-07-04 DIAGNOSIS — Z01419 Encounter for gynecological examination (general) (routine) without abnormal findings: Secondary | ICD-10-CM | POA: Insufficient documentation

## 2022-07-04 DIAGNOSIS — Z0000 Encounter for general adult medical examination without abnormal findings: Secondary | ICD-10-CM

## 2022-07-04 DIAGNOSIS — Z3041 Encounter for surveillance of contraceptive pills: Secondary | ICD-10-CM

## 2022-07-04 DIAGNOSIS — R7303 Prediabetes: Secondary | ICD-10-CM | POA: Insufficient documentation

## 2022-07-04 LAB — HEMOCCULT GUIAC POC 1CARD (OFFICE): Fecal Occult Blood, POC: NEGATIVE

## 2022-07-04 MED ORDER — DESOGESTREL-ETHINYL ESTRADIOL 0.15-30 MG-MCG PO TABS: 1.0000 | ORAL_TABLET | Freq: Every day | ORAL | 3 refills | Status: DC

## 2022-07-04 NOTE — Progress Notes (Signed)
Patient ID: Amanda Mccoy, female   DOB: [birth year], 41 y.o.   MRN: [id number] History of Present Illness: Amanda Mccoy is a 41 year old black female, has SO, [id number], in for a well woman gyn exam and pap. She works from home.   Current Medications, Allergies, Past Medical History, Past Surgical History, Family History and Social History were reviewed in [name] record.     Review of Systems: Patient denies any headaches, hearing loss, fatigue, blurred vision, shortness of breath, chest pain, abdominal pain, problems with bowel movements, urination, or intercourse. No joint pain or mood swings.     Physical Exam:BP 116/73 (BP Location: Left Arm, Patient Position: Sitting, Cuff Size: Normal)   Pulse (!) 56   Ht 5' 5.5" (1.664 m)   Wt 195 lb (88.5 kg)   LMP 06/13/2022   BMI 31.96 kg/m   General:  Well developed, well nourished, no acute distress Skin:  Warm and dry Neck:  Midline trachea, normal thyroid, good ROM, no lymphadenopathy Lungs; Clear to auscultation bilaterally Breast:  No dominant palpable mass, retraction, or nipple discharge Cardiovascular: Regular rate and rhythm Abdomen:  Soft, non tender, no hepatosplenomegaly Pelvic:  External genitalia is normal in appearance, no lesions.  The vagina is normal in appearance. Urethra has no lesions or masses. The cervix is bulbous.Pap with GC/CHL and HR HPV genotyping performed.  Uterus is felt to be normal size, shape, and contour.  No adnexal masses or tenderness noted.Bladder is non tender, no masses felt. Rectal: Good sphincter tone, no polyps, or hemorrhoids felt.  Hemoccult negative. Extremities/musculoskeletal:  No swelling or varicosities noted, no clubbing or cyanosis Psych:  No mood changes, alert and cooperative,seems happy AA is 2 Fall risk is 0    07/04/2022    8:35 AM 06/13/2021    1:41 PM 10/07/2019   10:39 AM  Depression screen PHQ 2/9  Decreased Interest 0 0 0  Down, Depressed, Hopeless 0 0 1   PHQ - 2 Score 0 0 1  Altered sleeping 1 2   Tired, decreased energy 1 1   Change in appetite 0 0   Feeling bad or failure about yourself  0 0   Trouble concentrating 0 0   Moving slowly or fidgety/restless 0 0   Suicidal thoughts 0 0   PHQ-9 Score 2 3        07/04/2022    8:35 AM 06/13/2021    1:41 PM  GAD 7 : Generalized Anxiety Score  Nervous, Anxious, on Edge 0 0  Control/stop worrying 0 0  Worry too much - different things 1 1  Trouble relaxing 1 0  Restless 1 0  Easily annoyed or irritable 0 0  Afraid - awful might happen 0 0  Total GAD 7 Score 3 1    Upstream - 07/04/22 0834       Pregnancy Intention Screening   Does the patient want to become pregnant in the next year? No    Does the patient's partner want to become pregnant in the next year? No    Would the patient like to discuss contraceptive options today? No      Contraception Wrap Up   Current Method Oral Contraceptive    End Method Oral Contraceptive            Examination chaperoned by Malachy Mood LPN   Impression and Plan: 1. Encounter for gynecological examination with Papanicolaou smear of cervix Pap sent Pap in 3 years if normal Physical  in 1 year Will check fasting labs  - Cytology - PAP( [place]) - CBC - Comprehensive metabolic panel - TSH - Lipid panel Has normal mammogram 06/21/21 Colonoscopy at 45  She is looking for PCP in [place].  2. Encounter for screening fecal occult blood testing - POCT occult blood stool Hemoccult was negative   3. Encounter for surveillance of contraceptive pills Will continue OCs Meds ordered this encounter  Medications   desogestrel-ethinyl estradiol (ISIBLOOM) 0.15-30 MG-MCG tablet    Sig: Take 1 tablet by mouth daily.    Dispense:  84 tablet    Refill:  3    Order Specific Question:   Supervising Provider    Answer:   Despina Hidden, LUTHER H [[id number]]    4. Elevated hemoglobin A1c - Hemoglobin A1c

## 2022-07-05 LAB — CBC
Hematocrit: 38.8 % (ref 34.0–46.6)
Hemoglobin: 13 g/dL (ref 11.1–15.9)
MCH: 30.4 pg (ref 26.6–33.0)
MCHC: 33.5 g/dL (ref 31.5–35.7)
MCV: 91 fL (ref 79–97)
Platelets: 330 10*3/uL (ref 150–450)
RBC: 4.28 x10E6/uL (ref 3.77–5.28)
RDW: 12.7 % (ref 11.7–15.4)
WBC: 9.9 10*3/uL (ref 3.4–10.8)

## 2022-07-05 LAB — COMPREHENSIVE METABOLIC PANEL
ALT: 9 IU/L (ref 0–32)
AST: 15 IU/L (ref 0–40)
Albumin/Globulin Ratio: 1.5 (ref 1.2–2.2)
Albumin: 4 g/dL (ref 3.9–4.9)
Alkaline Phosphatase: 51 IU/L (ref 44–121)
BUN/Creatinine Ratio: 21 (ref 9–23)
BUN: 16 mg/dL (ref 6–24)
Bilirubin Total: <0.2 mg/dL (ref 0.0–1.2)
CO2: 21 mmol/L (ref 20–29)
Calcium: 8.9 mg/dL (ref 8.7–10.2)
Chloride: 105 mmol/L (ref 96–106)
Creatinine, Ser: 0.78 mg/dL (ref 0.57–1.00)
Globulin, Total: 2.6 g/dL (ref 1.5–4.5)
Glucose: 97 mg/dL (ref 70–99)
Potassium: 4.9 mmol/L (ref 3.5–5.2)
Sodium: 141 mmol/L (ref 134–144)
Total Protein: 6.6 g/dL (ref 6.0–8.5)
eGFR: 98 mL/min/{1.73_m2} (ref >59)

## 2022-07-05 LAB — HEMOGLOBIN A1C
Est. average glucose Bld gHb Est-mCnc: 123 mg/dL
Hgb A1c MFr Bld: 5.9 % — ABNORMAL HIGH (ref 4.8–5.6)

## 2022-07-05 LAB — LIPID PANEL
Chol/HDL Ratio: 2.5 ratio (ref 0.0–4.4)
Cholesterol, Total: 189 mg/dL (ref 100–199)
HDL: 77 mg/dL (ref >39)
LDL Chol Calc (NIH): 93 mg/dL (ref 0–99)
Triglycerides: 107 mg/dL (ref 0–149)
VLDL Cholesterol Cal: 19 mg/dL (ref 5–40)

## 2022-07-05 LAB — TSH: TSH: 1.13 u[IU]/mL (ref 0.450–4.500)

## 2022-07-10 LAB — CYTOLOGY - PAP
Chlamydia: NEGATIVE
Comment: NEGATIVE
Comment: NEGATIVE
Comment: NORMAL
Diagnosis: NEGATIVE
High risk HPV: NEGATIVE
Neisseria Gonorrhea: NEGATIVE

## 2022-08-19 ENCOUNTER — Other Ambulatory Visit (HOSPITAL_COMMUNITY): Payer: Self-pay | Admitting: Adult Health

## 2022-08-19 DIAGNOSIS — Z1231 Encounter for screening mammogram for malignant neoplasm of breast: Secondary | ICD-10-CM

## 2022-09-09 ENCOUNTER — Ambulatory Visit (HOSPITAL_COMMUNITY)
Admission: RE | Admit: 2022-09-09 | Discharge: 2022-09-09 | Disposition: A | Discharge status: Home / Self Care | Payer: Medicaid Other | Source: Ambulatory Visit | Attending: Adult Health | Admitting: Adult Health

## 2022-09-09 DIAGNOSIS — Z1231 Encounter for screening mammogram for malignant neoplasm of breast: Secondary | ICD-10-CM | POA: Diagnosis not present

## 2023-02-13 DIAGNOSIS — Z113 Encounter for screening for infections with a predominantly sexual mode of transmission: Secondary | ICD-10-CM | POA: Diagnosis not present

## 2023-06-15 ENCOUNTER — Other Ambulatory Visit: Payer: Self-pay | Admitting: Adult Health

## 2023-07-15 ENCOUNTER — Ambulatory Visit: Payer: Medicaid Other | Admitting: Adult Health

## 2023-08-14 ENCOUNTER — Ambulatory Visit: Payer: Medicaid Other | Admitting: Adult Health

## 2023-09-18 ENCOUNTER — Ambulatory Visit: Payer: Medicaid Other | Admitting: Adult Health

## 2023-09-18 ENCOUNTER — Other Ambulatory Visit (HOSPITAL_COMMUNITY)
Admission: RE | Admit: 2023-09-18 | Discharge: 2023-09-18 | Disposition: A | Discharge status: Home / Self Care | Payer: Medicaid Other | Source: Ambulatory Visit | Attending: Adult Health | Admitting: Adult Health

## 2023-09-18 ENCOUNTER — Encounter: Payer: Self-pay | Admitting: Adult Health

## 2023-09-18 VITALS — BP 133/70 | HR 78 | Ht 65.5 in | Wt 184.0 lb

## 2023-09-18 DIAGNOSIS — Z800 Family history of malignant neoplasm of digestive organs: Secondary | ICD-10-CM | POA: Diagnosis not present

## 2023-09-18 DIAGNOSIS — Z1231 Encounter for screening mammogram for malignant neoplasm of breast: Secondary | ICD-10-CM | POA: Diagnosis not present

## 2023-09-18 DIAGNOSIS — Z1331 Encounter for screening for depression: Secondary | ICD-10-CM

## 2023-09-18 DIAGNOSIS — Z13220 Encounter for screening for lipoid disorders: Secondary | ICD-10-CM | POA: Diagnosis not present

## 2023-09-18 DIAGNOSIS — Z01419 Encounter for gynecological examination (general) (routine) without abnormal findings: Secondary | ICD-10-CM | POA: Diagnosis not present

## 2023-09-18 DIAGNOSIS — K6289 Other specified diseases of anus and rectum: Secondary | ICD-10-CM | POA: Insufficient documentation

## 2023-09-18 DIAGNOSIS — Z1212 Encounter for screening for malignant neoplasm of rectum: Secondary | ICD-10-CM

## 2023-09-18 DIAGNOSIS — R7309 Other abnormal glucose: Secondary | ICD-10-CM | POA: Diagnosis not present

## 2023-09-18 DIAGNOSIS — Z113 Encounter for screening for infections with a predominantly sexual mode of transmission: Secondary | ICD-10-CM | POA: Diagnosis not present

## 2023-09-18 DIAGNOSIS — Z3041 Encounter for surveillance of contraceptive pills: Secondary | ICD-10-CM

## 2023-09-18 DIAGNOSIS — Z1211 Encounter for screening for malignant neoplasm of colon: Secondary | ICD-10-CM | POA: Insufficient documentation

## 2023-09-18 LAB — HEMOCCULT GUIAC POC 1CARD (OFFICE): Fecal Occult Blood, POC: NEGATIVE

## 2023-09-18 MED ORDER — ISIBLOOM 0.15-30 MG-MCG PO TABS: 1.0000 | ORAL_TABLET | Freq: Every day | ORAL | 4 refills | Status: DC

## 2023-09-18 NOTE — Progress Notes (Signed)
Patient ID: Amanda Mccoy, female   DOB: [birth year], 42 y.o.   MRN: [id number] History of Present Illness: Amanda Mccoy is a 42 year old black female,single, [id number] in for a well woman gyn exam. She has had new partner and requests STD testing.     Component Value Date/Time   DIAGPAP  07/04/2022 0840    - Negative for intraepithelial lesion or malignancy (NILM)   DIAGPAP  10/07/2019 1151    - Negative for intraepithelial lesion or malignancy (NILM)   DIAGPAP  10/21/2017 0000    NEGATIVE FOR INTRAEPITHELIAL LESIONS OR MALIGNANCY.   HPVHIGH Negative 07/04/2022 0840   HPVHIGH Negative 10/07/2019 1151   ADEQPAP  07/04/2022 0840    Satisfactory for evaluation; transformation zone component PRESENT.   ADEQPAP  10/07/2019 1151    Satisfactory for evaluation; transformation zone component ABSENT.   ADEQPAP  10/21/2017 0000    Satisfactory for evaluation  endocervical/transformation zone component PRESENT.      Current Medications, Allergies, Past Medical History, Past Surgical History, Family History and Social History were reviewed in [name] record.     Review of Systems: Patient denies any headaches, hearing loss, fatigue, blurred vision, shortness of breath, chest pain, abdominal pain, problems with bowel movements, urination, or intercourse. No joint pain or mood swings.     Physical Exam:BP 133/70 (BP Location: Left Arm, Patient Position: Sitting, Cuff Size: Normal)   Pulse 78   Ht 5' 5.5" (1.664 m)   Wt 184 lb (83.5 kg)   LMP 08/30/2023 (Exact Date)   BMI 30.15 kg/m   General:  Well developed, well nourished, no acute distress Skin:  Warm and dry Neck:  Midline trachea, normal thyroid, good ROM, no lymphadenopathy Lungs; Clear to auscultation bilaterally Breast:  No dominant palpable mass, retraction, or nipple discharge Cardiovascular: Regular rate and rhythm Abdomen:  Soft, non tender, no hepatosplenomegaly Pelvic:  External genitalia is normal in  appearance, no lesions.  The vagina is normal in appearance, has white discharge, no odor. Urethra has no lesions or masses. The cervix is smooth.  Uterus is felt to be normal size, shape, and contour.  No adnexal masses or tenderness noted.Bladder is non tender, no masses felt.CV swab obtained. Rectal: Good sphincter tone, no polyps, or hemorrhoids felt.  Hemoccult negative.has firm nodule to right of rectal opening, she thinks its getting bigger and itches at times  Extremities/musculoskeletal:  No swelling or varicosities noted, no clubbing or cyanosis Psych:  No mood changes, alert and cooperative,seems happy AA is 3 Fall risk is low    09/18/2023   11:36 AM 07/04/2022    8:35 AM 06/13/2021    1:41 PM  Depression screen PHQ 2/9  Decreased Interest 0 0 0  Down, Depressed, Hopeless 0 0 0  PHQ - 2 Score 0 0 0  Altered sleeping 0 1 2  Tired, decreased energy 0 1 1  Change in appetite 0 0 0  Feeling bad or failure about yourself  0 0 0  Trouble concentrating 0 0 0  Moving slowly or fidgety/restless 0 0 0  Suicidal thoughts 0 0 0  PHQ-9 Score 0 2 3       09/18/2023   11:37 AM 07/04/2022    8:35 AM 06/13/2021    1:41 PM  GAD 7 : Generalized Anxiety Score  Nervous, Anxious, on Edge 0 0 0  Control/stop worrying 0 0 0  Worry too much - different things 0 1 1  Trouble relaxing 0 1  0  Restless 0 1 0  Easily annoyed or irritable 0 0 0  Afraid - awful might happen 0 0 0  Total GAD 7 Score 0 3 1      Upstream - 09/18/23 1134       Pregnancy Intention Screening   Does the patient want to become pregnant in the next year? No    Would the patient like to discuss contraceptive options today? No      Contraception Wrap Up   Current Method Oral Contraceptive    End Method Oral Contraceptive    Contraception Counseling Provided No             Examination chaperoned by Freddie Apley RN  Impression and plan: 1. Encounter for well woman exam with routine gynecological  exam Physical in 1 year Pap in 2026 Will check labs  - CBC - Comprehensive metabolic panel - Lipid panel  2. Encounter for screening fecal occult blood testing Hemoccult was negative  - POCT occult blood stool  3. Encounter for surveillance of contraceptive pills Happy with BCP will refill  Meds ordered this encounter  Medications   desogestrel-ethinyl estradiol (ISIBLOOM) 0.15-30 MG-MCG tablet    Sig: Take 1 tablet by mouth daily.    Dispense:  84 tablet    Refill:  4    Order Specific Question:   Supervising Provider    Answer:   Despina Hidden, LUTHER H [[id number]]     4. Elevated hemoglobin A1c - Hemoglobin A1c  5. Screening cholesterol level - Lipid panel  6. Screening examination for STD (sexually transmitted disease) CV swab sent for GC/CHL.trich.BV and yeast Will check HIV,RPR and hepatitis C antibody - HIV Antibody (routine testing w rflx) - RPR - Hepatitis C antibody - Cervicovaginal ancillary only( [facility])  7. Encounter for colorectal cancer screening Will refer to GI for colonoscopy   8. Family history of colon cancer Brother had at 24 and uncle died at 66 Will refer to Dr Marletta Lor for colonoscopy  - Ambulatory referral to Gastroenterology  9. Nodule of rectum Has firm nodule to right of rectal opening, she says it feels bigger and itches at times  - Ambulatory referral to Gastroenterology  10. Screening mammogram for breast cancer Screening mammogram scheduled for her for 10/09/23 at 8:45 am at [facility]  - MM 3D SCREENING MAMMOGRAM BILATERAL BREAST; Future

## 2023-09-19 LAB — CERVICOVAGINAL ANCILLARY ONLY
Bacterial Vaginitis (gardnerella): POSITIVE — AB
Candida Glabrata: NEGATIVE
Candida Vaginitis: POSITIVE — AB
Chlamydia: NEGATIVE
Comment: NEGATIVE
Comment: NEGATIVE
Comment: NEGATIVE
Comment: NEGATIVE
Comment: NEGATIVE
Comment: NORMAL
Neisseria Gonorrhea: NEGATIVE
Trichomonas: NEGATIVE

## 2023-09-20 LAB — RPR: RPR Ser Ql: REACTIVE — AB

## 2023-09-20 LAB — RPR, QUANT+TP ABS (REFLEX)
Rapid Plasma Reagin, Quant: 1:1 {titer} — ABNORMAL HIGH
T Pallidum Abs: NONREACTIVE

## 2023-09-20 LAB — COMPREHENSIVE METABOLIC PANEL
ALT: 8 [IU]/L (ref 0–32)
AST: 12 IU/L (ref 0–40)
Albumin: 4.2 g/dL (ref 3.9–4.9)
Alkaline Phosphatase: 47 IU/L (ref 44–121)
BUN/Creatinine Ratio: 14 (ref 9–23)
BUN: 12 mg/dL (ref 6–24)
Bilirubin Total: 0.3 mg/dL (ref 0.0–1.2)
CO2: 22 mmol/L (ref 20–29)
Calcium: 9.3 mg/dL (ref 8.7–10.2)
Chloride: 101 mmol/L (ref 96–106)
Creatinine, Ser: 0.83 mg/dL (ref 0.57–1.00)
Globulin, Total: 2.7 g/dL (ref 1.5–4.5)
Glucose: 80 mg/dL (ref 70–99)
Potassium: 4.8 mmol/L (ref 3.5–5.2)
Sodium: 140 mmol/L (ref 134–144)
Total Protein: 6.9 g/dL (ref 6.0–8.5)
eGFR: 90 mL/min/{1.73_m2} (ref >59)

## 2023-09-20 LAB — LIPID PANEL
Chol/HDL Ratio: 2.3 ratio (ref 0.0–4.4)
Cholesterol, Total: 194 mg/dL (ref 100–199)
HDL: 86 mg/dL (ref >39)
LDL Chol Calc (NIH): 89 mg/dL (ref 0–99)
Triglycerides: 110 mg/dL (ref 0–149)
VLDL Cholesterol Cal: 19 mg/dL (ref 5–40)

## 2023-09-20 LAB — CBC
Hematocrit: 40.8 % (ref 34.0–46.6)
Hemoglobin: 13.5 g/dL (ref 11.1–15.9)
MCH: 31.5 pg (ref 26.6–33.0)
MCHC: 33.1 g/dL (ref 31.5–35.7)
MCV: 95 fL (ref 79–97)
Platelets: 288 10*3/uL (ref 150–450)
RBC: 4.29 x10E6/uL (ref 3.77–5.28)
RDW: 12.7 % (ref 11.7–15.4)
WBC: 11.4 10*3/uL — ABNORMAL HIGH (ref 3.4–10.8)

## 2023-09-20 LAB — HIV ANTIBODY (ROUTINE TESTING W REFLEX): HIV Screen 4th Generation wRfx: NONREACTIVE

## 2023-09-20 LAB — HEPATITIS C ANTIBODY: Hep C Virus Ab: NONREACTIVE

## 2023-09-20 LAB — HEMOGLOBIN A1C
Est. average glucose Bld gHb Est-mCnc: 117 mg/dL
Hgb A1c MFr Bld: 5.7 % — ABNORMAL HIGH (ref 4.8–5.6)

## 2023-09-22 ENCOUNTER — Other Ambulatory Visit: Payer: Self-pay | Admitting: Adult Health

## 2023-09-22 MED ORDER — FLUCONAZOLE 150 MG PO TABS
ORAL_TABLET | ORAL | 1 refills | Status: DC
Start: 2023-09-22 — End: 2023-09-25

## 2023-09-22 MED ORDER — METRONIDAZOLE 500 MG PO TABS: 500.0000 mg | ORAL_TABLET | Freq: Two times a day (BID) | ORAL | 0 refills | Status: DC

## 2023-09-22 NOTE — Progress Notes (Signed)
+  BV and yeast on vaginal swab, will rx flagyl and diflucan, no sex or alcohol while taking meds  

## 2023-09-24 NOTE — H&P (View-Only) (Signed)
GI Office Note    Referring Provider: No ref. provider found Primary Care Physician:  Patient, No Pcp Per  Primary Gastroenterologist: Gerrit Friends.Rourk, MD  Chief Complaint   Chief Complaint  Patient presents with   Colonoscopy    Colonoscopy screening. Nodule near rectum.    History of Present Illness   Amanda Mccoy is a 42 y.o. female presenting today at the request of No ref. provider found for rectal nodule and colonoscopy given strong family history of colon cancer.   Per referral her brother had colon cancer at 99, maternal uncle died at 37 with colon cancer. Per review of her OB/GYN note her pelvic exam was reported as normal.  On rectal exam she was noted to have good sphincter tone without polyps or hemorrhoids, Hemoccult negative but also had a firm nodule to the right of the rectal opening which patient felt was getting bigger and itches at times.  Today: Has had some rectal itching. Had hemorrhoid s when pregnant. Has felt some hardness with wiping and has noticed it has been bigger within the last year. No rectal pain or pain with bowel movement. No rectal bleeding. She reports this nodule is on the outside.   No abdominal pain. Very regular and goes every morning. Sometimes goes 2-3 times a day.   Brother had 2 ft of bowel removed. He was stage 2 at the time. Brother is now 75 (was about 64 when diagnosed - he just mentioned it to her within the last year).   LMP 09/25/23.    Wt Readings from Last 3 Encounters:  09/25/23 188 lb 3.2 oz (85.4 kg)  09/18/23 184 lb (83.5 kg)  07/04/22 195 lb (88.5 kg)    Current Outpatient Medications  Medication Sig Dispense Refill   desogestrel-ethinyl estradiol (ISIBLOOM) 0.15-30 MG-MCG tablet Take 1 tablet by mouth daily. 84 tablet 4   fluconazole (DIFLUCAN) 150 MG tablet Take 1 now and repeat in 1 3 days if needed (Patient not taking: Reported on 09/25/2023) 2 tablet 1   metroNIDAZOLE (FLAGYL) 500 MG tablet Take 1 tablet  (500 mg total) by mouth 2 (two) times daily. (Patient not taking: Reported on 09/25/2023) 14 tablet 0   Multiple Vitamin (MULTIVITAMIN) tablet Take 1 tablet by mouth daily. (Patient not taking: Reported on 09/25/2023)     No current facility-administered medications for this visit.    Past Medical History:  Diagnosis Date   Gestational diabetes    1st pregnancy    Past Surgical History:  Procedure Laterality Date   NO PAST SURGERIES      Family History  Problem Relation Age of Onset   Cancer Paternal Grandfather        prostate   Emphysema Maternal Grandmother    Cancer Maternal Grandfather        colon   Hyperlipidemia Father    Hyperlipidemia Mother    Colon cancer Brother    Hypertension Other    Stroke Other    Cancer Other        prostate, colon, cervical   Heart attack Other    Coronary artery disease Other     Allergies as of 09/25/2023   (No Known Allergies)    Social History   Socioeconomic History   Marital status: Single    Spouse name: Not on file   Number of children: 5   Years of education: Not on file   Highest education level: Not on file  Occupational History  Not on file  Tobacco Use   Smoking status: Former    Types: Cigarettes   Smokeless tobacco: Never  Vaping Use   Vaping status: Never Used  Substance and Sexual Activity   Alcohol use: Yes    Comment: rarely   Drug use: No    Types: Marijuana    Comment: not since high school.   Sexual activity: Yes    Birth control/protection: Pill  Other Topics Concern   Not on file  Social History Narrative   Not on file   Social Determinants of Health   Financial Resource Strain: Low Risk  (09/18/2023)   Overall Financial Resource Strain (CARDIA)    Difficulty of Paying Living Expenses: Not hard at all  Food Insecurity: No Food Insecurity (09/18/2023)   Hunger Vital Sign    Worried About Running Out of Food in the Last Year: Never true    Ran Out of Food in the Last Year: Never  true  Transportation Needs: No Transportation Needs (09/18/2023)   PRAPARE - Administrator, Civil Service (Medical): No    Lack of Transportation (Non-Medical): No  Physical Activity: Insufficiently Active (09/18/2023)   Exercise Vital Sign    Days of Exercise per Week: 2 days    Minutes of Exercise per Session: 30 min  Stress: No Stress Concern Present (09/18/2023)   [employer] of Occupational Health - Occupational Stress Questionnaire    Feeling of Stress : Only a little  Social Connections: Moderately Isolated (09/18/2023)   Social Connection and Isolation Panel [NHANES]    Frequency of Communication with Friends and Family: More than three times a week    Frequency of Social Gatherings with Friends and Family: Once a week    Attends Religious Services: 1 to 4 times per year    Active Member of [employer] or Organizations: No    Attends Banker Meetings: Never    Marital Status: Never married  Intimate Partner Violence: Not At Risk (09/18/2023)   Humiliation, Afraid, Rape, and Kick questionnaire    Fear of Current or Ex-Partner: No    Emotionally Abused: No    Physically Abused: No    Sexually Abused: No     Review of Systems   Gen: Denies any fever, chills, fatigue, weight loss, lack of appetite.  CV: Denies chest pain, heart palpitations, peripheral edema, syncope.  Resp: Denies shortness of breath at rest or with exertion. Denies wheezing or cough.  GI: see HPI GU : Denies urinary burning, urinary frequency, urinary hesitancy MS: Denies joint pain, muscle weakness, cramps, or limitation of movement.  Derm: Denies rash, itching, dry skin Psych: Denies depression, anxiety, memory loss, and confusion Heme: Denies bruising, bleeding, and enlarged lymph nodes.  Physical Exam   BP 125/82 (BP Location: Right Arm, Patient Position: Sitting, Cuff Size: Large)   Pulse 81   Temp 98 F (36.7 C) (Temporal)   Ht 5' 5.5" (1.664 m)   Wt 188 lb 3.2 oz  (85.4 kg)   LMP 09/25/2023 (Approximate)   BMI 30.84 kg/m   General:   Alert and oriented. Pleasant and cooperative. Well-nourished and well-developed.  Head:  Normocephalic and atraumatic. Eyes:  Without icterus, sclera clear and conjunctiva pink.  Ears:  Normal auditory acuity. Mouth:  No deformity or lesions, oral mucosa pink.  Lungs:  Clear to auscultation bilaterally. No wheezes, rales, or rhonchi. No distress.  Heart:  S1, S2 present without murmurs appreciated.  Abdomen:  +BS, soft, non-tender  and non-distended. No HSM noted. No guarding or rebound. No masses appreciated.  Rectal:  small cyst like skin nodule about 5 o clock position (anteriorly). Good rectal tone but firmness and tenderness noted in right anterior rectal column.  Msk:  Symmetrical without gross deformities. Normal posture. Extremities:  Without edema. Neurologic:  Alert and  oriented x4;  grossly normal neurologically. Skin:  Intact without significant lesions or rashes. Psych:  Alert and cooperative. Normal mood and affect.  Assessment   Amanda Mccoy is a 42 y.o. female with no significant medical history presenting today to discuss colonoscopy given strong family history of colon cancer and rectal abnormality noted at rectal exam with GYN.  Rectal abnormality: Small skin like cystic lesion that is mobile located about 5 o'clock position just above the anal canal the patient is itching at times and appears bigger than in the past.  This is likely a dermatologic lesion that I advised her to follow-up with dermatology to discuss removal.  Coincidentally she also has some firmness and tenderness noted internally in the anal canal in the right anterior column.  This is not consistent with hemorrhoid tissue.  No evidence of any HSV lesions.  Patient is on her menstrual cycle and does have a tampon in place however this unlikely to cause this firmness. Unclear if tenderness is hypersensitivity given her menstrual cycle  or if coming from nodule itself. Given her family history we will evaluate further with a colonoscopy in the very near future.  Family history of colon cancer: Family history of colon cancer in her brother at age 82 as well as maternal uncle.  Maternal uncle passed away at age 84.  Given her current rectal abnormality and her strong family history we will evaluate fairly urgently with a colonoscopy.  We discussed all potential etiologies in the office today and discussed risk versus benefits of procedure and she has agreed to proceed.  PLAN   Proceed with colonoscopy with propofol by Dr. Jena Gauss in near future: the risks, benefits, and alternatives have been discussed with the patient in detail. The patient states understanding and desires to proceed. ASA 2 (ASAP) UPT Follow up with dermatology to discuss external skin lesion Further recommendations to follow.    Brooke Bonito, MSN, FNP-BC, AGACNP-BC [facility]

## 2023-09-24 NOTE — Progress Notes (Unsigned)
GI Office Note    Referring Provider: No ref. provider found Primary Care Physician:  Patient, No Pcp Per  Primary Gastroenterologist: Gerrit Friends.Rourk, MD  Chief Complaint   Chief Complaint  Patient presents with   Colonoscopy    Colonoscopy screening. Nodule near rectum.    History of Present Illness   Amanda Mccoy is a 42 y.o. female presenting today at the request of No ref. provider found for rectal nodule and colonoscopy given strong family history of colon cancer.   Per referral her brother had colon cancer at 80, maternal uncle died at 39 with colon cancer. Per review of her OB/GYN note her pelvic exam was reported as normal.  On rectal exam she was noted to have good sphincter tone without polyps or hemorrhoids, Hemoccult negative but also had a firm nodule to the right of the rectal opening which patient felt was getting bigger and itches at times.  Today: Has had some rectal itching. Had hemorrhoid s when pregnant. Has felt some hardness with wiping and has noticed it has been bigger within the last year. No rectal pain or pain with bowel movement. No rectal bleeding. She reports this nodule is on the outside.   No abdominal pain. Very regular and goes every morning. Sometimes goes 2-3 times a day.   Brother had 2 ft of bowel removed. He was stage 2 at the time. Brother is now 37 (was about 61 when diagnosed - he just mentioned it to her within the last year).   LMP 09/25/23.    Wt Readings from Last 3 Encounters:  09/25/23 188 lb 3.2 oz (85.4 kg)  09/18/23 184 lb (83.5 kg)  07/04/22 195 lb (88.5 kg)    Current Outpatient Medications  Medication Sig Dispense Refill   desogestrel-ethinyl estradiol (ISIBLOOM) 0.15-30 MG-MCG tablet Take 1 tablet by mouth daily. 84 tablet 4   fluconazole (DIFLUCAN) 150 MG tablet Take 1 now and repeat in 1 3 days if needed (Patient not taking: Reported on 09/25/2023) 2 tablet 1   metroNIDAZOLE (FLAGYL) 500 MG tablet Take 1 tablet  (500 mg total) by mouth 2 (two) times daily. (Patient not taking: Reported on 09/25/2023) 14 tablet 0   Multiple Vitamin (MULTIVITAMIN) tablet Take 1 tablet by mouth daily. (Patient not taking: Reported on 09/25/2023)     No current facility-administered medications for this visit.    Past Medical History:  Diagnosis Date   Gestational diabetes    1st pregnancy    Past Surgical History:  Procedure Laterality Date   NO PAST SURGERIES      Family History  Problem Relation Age of Onset   Cancer Paternal Grandfather        prostate   Emphysema Maternal Grandmother    Cancer Maternal Grandfather        colon   Hyperlipidemia Father    Hyperlipidemia Mother    Colon cancer Brother    Hypertension Other    Stroke Other    Cancer Other        prostate, colon, cervical   Heart attack Other    Coronary artery disease Other     Allergies as of 09/25/2023   (No Known Allergies)    Social History   Socioeconomic History   Marital status: Single    Spouse name: Not on file   Number of children: 5   Years of education: Not on file   Highest education level: Not on file  Occupational History  Not on file  Tobacco Use   Smoking status: Former    Types: Cigarettes   Smokeless tobacco: Never  Vaping Use   Vaping status: Never Used  Substance and Sexual Activity   Alcohol use: Yes    Comment: rarely   Drug use: No    Types: Marijuana    Comment: not since high school.   Sexual activity: Yes    Birth control/protection: Pill  Other Topics Concern   Not on file  Social History Narrative   Not on file   Social Determinants of Health   Financial Resource Strain: Low Risk  (09/18/2023)   Overall Financial Resource Strain (CARDIA)    Difficulty of Paying Living Expenses: Not hard at all  Food Insecurity: No Food Insecurity (09/18/2023)   Hunger Vital Sign    Worried About Running Out of Food in the Last Year: Never true    Ran Out of Food in the Last Year: Never  true  Transportation Needs: No Transportation Needs (09/18/2023)   PRAPARE - Administrator, Civil Service (Medical): No    Lack of Transportation (Non-Medical): No  Physical Activity: Insufficiently Active (09/18/2023)   Exercise Vital Sign    Days of Exercise per Week: 2 days    Minutes of Exercise per Session: 30 min  Stress: No Stress Concern Present (09/18/2023)   [employer] of Occupational Health - Occupational Stress Questionnaire    Feeling of Stress : Only a little  Social Connections: Moderately Isolated (09/18/2023)   Social Connection and Isolation Panel [NHANES]    Frequency of Communication with Friends and Family: More than three times a week    Frequency of Social Gatherings with Friends and Family: Once a week    Attends Religious Services: 1 to 4 times per year    Active Member of [employer] or Organizations: No    Attends Banker Meetings: Never    Marital Status: Never married  Intimate Partner Violence: Not At Risk (09/18/2023)   Humiliation, Afraid, Rape, and Kick questionnaire    Fear of Current or Ex-Partner: No    Emotionally Abused: No    Physically Abused: No    Sexually Abused: No     Review of Systems   Gen: Denies any fever, chills, fatigue, weight loss, lack of appetite.  CV: Denies chest pain, heart palpitations, peripheral edema, syncope.  Resp: Denies shortness of breath at rest or with exertion. Denies wheezing or cough.  GI: see HPI GU : Denies urinary burning, urinary frequency, urinary hesitancy MS: Denies joint pain, muscle weakness, cramps, or limitation of movement.  Derm: Denies rash, itching, dry skin Psych: Denies depression, anxiety, memory loss, and confusion Heme: Denies bruising, bleeding, and enlarged lymph nodes.  Physical Exam   BP 125/82 (BP Location: Right Arm, Patient Position: Sitting, Cuff Size: Large)   Pulse 81   Temp 98 F (36.7 C) (Temporal)   Ht 5' 5.5" (1.664 m)   Wt 188 lb 3.2 oz  (85.4 kg)   LMP 09/25/2023 (Approximate)   BMI 30.84 kg/m   General:   Alert and oriented. Pleasant and cooperative. Well-nourished and well-developed.  Head:  Normocephalic and atraumatic. Eyes:  Without icterus, sclera clear and conjunctiva pink.  Ears:  Normal auditory acuity. Mouth:  No deformity or lesions, oral mucosa pink.  Lungs:  Clear to auscultation bilaterally. No wheezes, rales, or rhonchi. No distress.  Heart:  S1, S2 present without murmurs appreciated.  Abdomen:  +BS, soft, non-tender  and non-distended. No HSM noted. No guarding or rebound. No masses appreciated.  Rectal:  small cyst like skin nodule about 5 o clock position (anteriorly). Good rectal tone but firmness and tenderness noted in right anterior rectal column.  Msk:  Symmetrical without gross deformities. Normal posture. Extremities:  Without edema. Neurologic:  Alert and  oriented x4;  grossly normal neurologically. Skin:  Intact without significant lesions or rashes. Psych:  Alert and cooperative. Normal mood and affect.  Assessment   Amanda Mccoy is a 42 y.o. female with no significant medical history presenting today to discuss colonoscopy given strong family history of colon cancer and rectal abnormality noted at rectal exam with GYN.  Rectal abnormality: Small skin like cystic lesion that is mobile located about 5 o'clock position just above the anal canal the patient is itching at times and appears bigger than in the past.  This is likely a dermatologic lesion that I advised her to follow-up with dermatology to discuss removal.  Coincidentally she also has some firmness and tenderness noted internally in the anal canal in the right anterior column.  This is not consistent with hemorrhoid tissue.  No evidence of any HSV lesions.  Patient is on her menstrual cycle and does have a tampon in place however this unlikely to cause this firmness. Unclear if tenderness is hypersensitivity given her menstrual cycle  or if coming from nodule itself. Given her family history we will evaluate further with a colonoscopy in the very near future.  Family history of colon cancer: Family history of colon cancer in her brother at age 27 as well as maternal uncle.  Maternal uncle passed away at age 65.  Given her current rectal abnormality and her strong family history we will evaluate fairly urgently with a colonoscopy.  We discussed all potential etiologies in the office today and discussed risk versus benefits of procedure and she has agreed to proceed.  PLAN   Proceed with colonoscopy with propofol by Dr. Jena Gauss in near future: the risks, benefits, and alternatives have been discussed with the patient in detail. The patient states understanding and desires to proceed. ASA 2 (ASAP) UPT Follow up with dermatology to discuss external skin lesion Further recommendations to follow.    Brooke Bonito, MSN, FNP-BC, AGACNP-BC [facility]

## 2023-09-25 ENCOUNTER — Ambulatory Visit: Payer: Medicaid Other | Admitting: Gastroenterology

## 2023-09-25 ENCOUNTER — Other Ambulatory Visit: Payer: Self-pay | Admitting: *Deleted

## 2023-09-25 ENCOUNTER — Encounter: Payer: Self-pay | Admitting: *Deleted

## 2023-09-25 ENCOUNTER — Encounter: Payer: Self-pay | Admitting: Gastroenterology

## 2023-09-25 VITALS — BP 125/82 | HR 81 | Temp 98.0°F | Ht 65.5 in | Wt 188.2 lb

## 2023-09-25 DIAGNOSIS — Z800 Family history of malignant neoplasm of digestive organs: Secondary | ICD-10-CM

## 2023-09-25 DIAGNOSIS — K6289 Other specified diseases of anus and rectum: Secondary | ICD-10-CM

## 2023-09-25 NOTE — Patient Instructions (Signed)
We are getting you scheduled for colonoscopy in the very near future with Dr. Marletta Lor.  Please discuss the external skin lesion with dermatology to see if this is something they would be willing to remove.  As of right now I feel like this is very benign.   It was a pleasure to see you today. I want to create trusting relationships with patients. If you receive a survey regarding your visit,  I greatly appreciate you taking time to fill this out on paper or through your MyChart. I value your feedback.  Brooke Bonito, MSN, FNP-BC, AGACNP-BC [facility]

## 2023-09-26 ENCOUNTER — Encounter: Payer: Self-pay | Admitting: Gastroenterology

## 2023-10-09 ENCOUNTER — Ambulatory Visit (HOSPITAL_COMMUNITY): Payer: Medicaid Other | Admitting: Anesthesiology

## 2023-10-09 ENCOUNTER — Ambulatory Visit (HOSPITAL_COMMUNITY)
Admission: RE | Admit: 2023-10-09 | Discharge: 2023-10-09 | Disposition: A | Discharge status: Home / Self Care | Payer: Medicaid Other | Attending: Internal Medicine | Admitting: Internal Medicine

## 2023-10-09 ENCOUNTER — Ambulatory Visit (HOSPITAL_COMMUNITY)
Admission: RE | Admit: 2023-10-09 | Discharge: 2023-10-09 | Disposition: A | Discharge status: Home / Self Care | Payer: Medicaid Other | Source: Ambulatory Visit | Attending: Adult Health | Admitting: Adult Health

## 2023-10-09 ENCOUNTER — Other Ambulatory Visit: Payer: Self-pay

## 2023-10-09 ENCOUNTER — Encounter (HOSPITAL_COMMUNITY): Payer: Self-pay | Admitting: Internal Medicine

## 2023-10-09 ENCOUNTER — Encounter (HOSPITAL_COMMUNITY)
Admission: RE | Disposition: A | Discharge status: Home / Self Care | Payer: Self-pay | Source: Home / Self Care | Attending: Internal Medicine

## 2023-10-09 DIAGNOSIS — Z1211 Encounter for screening for malignant neoplasm of colon: Secondary | ICD-10-CM | POA: Insufficient documentation

## 2023-10-09 DIAGNOSIS — D128 Benign neoplasm of rectum: Secondary | ICD-10-CM | POA: Diagnosis not present

## 2023-10-09 DIAGNOSIS — L299 Pruritus, unspecified: Secondary | ICD-10-CM | POA: Diagnosis not present

## 2023-10-09 DIAGNOSIS — Z800 Family history of malignant neoplasm of digestive organs: Secondary | ICD-10-CM

## 2023-10-09 DIAGNOSIS — I10 Essential (primary) hypertension: Secondary | ICD-10-CM | POA: Diagnosis not present

## 2023-10-09 DIAGNOSIS — Z87891 Personal history of nicotine dependence: Secondary | ICD-10-CM | POA: Insufficient documentation

## 2023-10-09 DIAGNOSIS — Z1231 Encounter for screening mammogram for malignant neoplasm of breast: Secondary | ICD-10-CM | POA: Diagnosis not present

## 2023-10-09 DIAGNOSIS — D127 Benign neoplasm of rectosigmoid junction: Secondary | ICD-10-CM

## 2023-10-09 DIAGNOSIS — K644 Residual hemorrhoidal skin tags: Secondary | ICD-10-CM | POA: Diagnosis not present

## 2023-10-09 DIAGNOSIS — K621 Rectal polyp: Secondary | ICD-10-CM | POA: Diagnosis not present

## 2023-10-09 HISTORY — PX: POLYPECTOMY: SHX5525

## 2023-10-09 HISTORY — PX: COLONOSCOPY WITH PROPOFOL: SHX5780

## 2023-10-09 LAB — POCT PREGNANCY, URINE: Preg Test, Ur: NEGATIVE

## 2023-10-09 SURGERY — COLONOSCOPY WITH PROPOFOL
Anesthesia: General

## 2023-10-09 MED ORDER — PROPOFOL 500 MG/50ML IV EMUL
INTRAVENOUS | Status: DC | PRN
  Administered 2023-10-09: 150 ug/kg/min via INTRAVENOUS

## 2023-10-09 MED ORDER — DEXMEDETOMIDINE HCL IN NACL 80 MCG/20ML IV SOLN
INTRAVENOUS | Status: DC | PRN
  Administered 2023-10-09: 8 ug via INTRAVENOUS
  Administered 2023-10-09: 12 ug via INTRAVENOUS

## 2023-10-09 MED ORDER — STERILE WATER FOR IRRIGATION IR SOLN
Status: DC | PRN
  Administered 2023-10-09: 60 mL

## 2023-10-09 MED ORDER — LACTATED RINGERS IV SOLN: INTRAVENOUS | Status: DC

## 2023-10-09 MED ORDER — PROPOFOL 10 MG/ML IV BOLUS
INTRAVENOUS | Status: DC | PRN
  Administered 2023-10-09: 50 mg via INTRAVENOUS
  Administered 2023-10-09: 100 mg via INTRAVENOUS

## 2023-10-09 MED ORDER — LIDOCAINE HCL (PF) 2 % IJ SOLN
INTRAMUSCULAR | Status: DC | PRN
  Administered 2023-10-09: 50 mg via INTRADERMAL

## 2023-10-09 NOTE — Op Note (Signed)
[facility] Patient Name: Amanda Mccoy Procedure Date: 10/09/2023 10:27 AM MRN: [id number] Date of Birth: 1980/11/06 Attending MD: Gennette Pac , MD, [id number] CSN: [id number] Age: 42 Admit Type: Outpatient Procedure:                Colonoscopy Indications:              Screening in patient at increased risk: Family                            history of 1st-degree relative with colorectal                            cancer before age 34 years Providers:                Gennette Pac, MD, Angelica Ran, Pandora Leiter, Technician Referring MD:              Medicines:                Propofol per Anesthesia Complications:            No immediate complications. Estimated Blood Loss:     Estimated blood loss was minimal. Procedure:                Pre-Anesthesia Assessment:                           - Prior to the procedure, a History and Physical                            was performed, and patient medications and                            allergies were reviewed. The patient's tolerance of                            previous anesthesia was also reviewed. The risks                            and benefits of the procedure and the sedation                            options and risks were discussed with the patient.                            All questions were answered, and informed consent                            was obtained. Prior Anticoagulants: The patient has                            taken no anticoagulant or antiplatelet agents. ASA  Grade Assessment: II - A patient with mild systemic                            disease. After reviewing the risks and benefits,                            the patient was deemed in satisfactory condition to                            undergo the procedure.                           After obtaining informed consent, the colonoscope                            was passed under  direct vision. Throughout the                            procedure, the patient's blood pressure, pulse, and                            oxygen saturations were monitored continuously. The                            [id number]) scope was introduced through the                            anus and advanced to the the cecum, identified by                            appendiceal orifice and ileocecal valve. The                            colonoscopy was performed without difficulty. The                            patient tolerated the procedure well. The quality                            of the bowel preparation was adequate. The                            ileocecal valve, appendiceal orifice, and rectum                            were photographed. The colonoscopy was performed                            without difficulty. The entire colon was well                            visualized. Scope In: 10:48:25 AM Scope Out: 11:05:03 AM Scope Withdrawal Time: 0 hours 10 minutes 31 seconds  Total Procedure Duration: 0 hours 16 minutes 38 seconds  Findings:      The perianal and digital rectal examinations were normal except for a 3       mm perianal skin tag/papilla.      Two sessile polyps were found in the rectum and mid rectum. The polyps       were 4 to 5 mm in size. These polyps were removed with a cold snare.       Resection and retrieval were complete. Estimated blood loss was minimal.      The exam was otherwise without abnormality on direct and retroflexion       views. Impression:               - Two 4 to 5 mm polyps in the rectum and in the mid                            rectum, removed with a cold snare. Resected and                            retrieved.                           - The examination was otherwise normal on direct                            and retroflexion views. Perianal skin lesion Moderate Sedation:      Moderate (conscious) sedation was personally  administered by an       anesthesia professional. The following parameters were monitored: oxygen       saturation, heart rate, blood pressure, respiratory rate, EKG, adequacy       of pulmonary ventilation, and response to care. Recommendation:           - Patient has a contact number available for                            emergencies. The signs and symptoms of potential                            delayed complications were discussed with the                            patient. Return to normal activities tomorrow.                            Written discharge instructions were provided to the                            patient.                           - Advance diet as tolerated.                           - Continue present medications. See dermatologist                            regarding removal of perianal skin lesion                           -  Repeat colonoscopy date to be determined after                            pending pathology results are reviewed for                            surveillance.                           - Return to GI office (date not yet determined). Procedure Code(s):        --- Professional ---                           [phone], Colonoscopy, flexible; with removal of                            tumor(s), polyp(s), or other lesion(s) by snare                            technique Diagnosis Code(s):        --- Professional ---                           Z80.0, Family history of malignant neoplasm of                            digestive organs                           D12.8, Benign neoplasm of rectum CPT copyright 2022 American Medical Association. All rights reserved. The codes documented in this report are preliminary and upon coder review may  be revised to meet current compliance requirements. Gerrit Friends. Kery Batzel, MD Gennette Pac, MD 10/09/2023 11:10:54 AM This report has been signed electronically. Number of Addenda: 0

## 2023-10-09 NOTE — Anesthesia Preprocedure Evaluation (Signed)
Anesthesia Evaluation  Patient identified by MRN, date of birth, ID band Patient awake    Reviewed: Allergy & Precautions, H&P , NPO status , Patient's Chart, lab work & pertinent test results, reviewed documented beta blocker date and time   Airway Mallampati: II  TM Distance: >3 FB Neck ROM: full    Dental no notable dental hx.    Pulmonary neg pulmonary ROS, former smoker   Pulmonary exam normal breath sounds clear to auscultation       Cardiovascular Exercise Tolerance: Good hypertension, negative cardio ROS  Rhythm:regular Rate:Normal     Neuro/Psych negative neurological ROS  negative psych ROS   GI/Hepatic negative GI ROS, Neg liver ROS,,,  Endo/Other  negative endocrine ROS    Renal/GU negative Renal ROS  negative genitourinary   Musculoskeletal   Abdominal   Peds  Hematology negative hematology ROS (+)   Anesthesia Other Findings   Reproductive/Obstetrics negative OB ROS                             Anesthesia Physical Anesthesia Plan  ASA: 1  Anesthesia Plan: General   Post-op Pain Management:    Induction:   PONV Risk Score and Plan: Propofol infusion  Airway Management Planned:   Additional Equipment:   Intra-op Plan:   Post-operative Plan:   Informed Consent: I have reviewed the patients History and Physical, chart, labs and discussed the procedure including the risks, benefits and alternatives for the proposed anesthesia with the patient or authorized representative who has indicated his/her understanding and acceptance.     Dental Advisory Given  Plan Discussed with: CRNA  Anesthesia Plan Comments:        Anesthesia Quick Evaluation

## 2023-10-09 NOTE — Discharge Instructions (Addendum)
  Colonoscopy Discharge Instructions  Read the instructions outlined below and refer to this sheet in the next few weeks. These discharge instructions provide you with general information on caring for yourself after you leave the hospital. Your doctor may also give you specific instructions. While your treatment has been planned according to the most current medical practices available, unavoidable complications occasionally occur. If you have any problems or questions after discharge, call Dr. Jena Gauss at [phone]. ACTIVITY You may resume your regular activity, but move at a slower pace for the next 24 hours.  Take frequent rest periods for the next 24 hours.  Walking will help get rid of the air and reduce the bloated feeling in your belly (abdomen).  No driving for 24 hours (because of the medicine (anesthesia) used during the test).   Do not sign any important legal documents or operate any machinery for 24 hours (because of the anesthesia used during the test).  NUTRITION Drink plenty of fluids.  You may resume your normal diet as instructed by your doctor.  Begin with a light meal and progress to your normal diet. Heavy or fried foods are harder to digest and may make you feel sick to your stomach (nauseated).  Avoid alcoholic beverages for 24 hours or as instructed.  MEDICATIONS You may resume your normal medications unless your doctor tells you otherwise.  WHAT YOU CAN EXPECT TODAY Some feelings of bloating in the abdomen.  Passage of more gas than usual.  Spotting of blood in your stool or on the toilet paper.  IF YOU HAD POLYPS REMOVED DURING THE COLONOSCOPY: No aspirin products for 7 days or as instructed.  No alcohol for 7 days or as instructed.  Eat a soft diet for the next 24 hours.  FINDING OUT THE RESULTS OF YOUR TEST Not all test results are available during your visit. If your test results are not back during the visit, make an appointment with your caregiver to find out the  results. Do not assume everything is normal if you have not heard from your caregiver or the medical facility. It is important for you to follow up on all of your test results.  SEEK IMMEDIATE MEDICAL ATTENTION IF: You have more than a spotting of blood in your stool.  Your belly is swollen (abdominal distention).  You are nauseated or vomiting.  You have a temperature over 101.  You have abdominal pain or discomfort that is severe or gets worse throughout the day.      The lesion on the outside of your rectum is coming from the perirectal skin.  It appears benign.  It would be appropriate to see a dermatologist and have it removed.  2 small polyps in your rectum which were removed; otherwise your colon appeared entirely normal  Further recommendations to follow pending review of pathology report

## 2023-10-09 NOTE — Interval H&P Note (Signed)
History and Physical Interval Note:  10/09/2023 10:33 AM  Amanda Mccoy  has presented today for surgery, with the diagnosis of rectal nodule/firmness, FHX: colon cancer.  The various methods of treatment have been discussed with the patient and family. After consideration of risks, benefits and other options for treatment, the patient has consented to  Procedure(s) with comments: COLONOSCOPY WITH PROPOFOL (N/A) - 11:15 am, asa 2 as a surgical intervention.  The patient's history has been reviewed, patient examined, no change in status, stable for surgery.  I have reviewed the patient's chart and labs.  Questions were answered to the patient's satisfaction.     Amanda Mccoy   no change.  Diagnostic colonoscopy per plan.  The risks, benefits, limitations, alternatives and imponderables have been reviewed with the patient. Questions have been answered. All parties are agreeable.

## 2023-10-09 NOTE — Anesthesia Procedure Notes (Signed)
Date/Time: 10/09/2023 10:42 AM  Performed by: Julian Reil, CRNAPre-anesthesia Checklist: Patient identified, Emergency Drugs available, Suction available and Patient being monitored Patient Re-evaluated:Patient Re-evaluated prior to induction Oxygen Delivery Method: Nasal cannula Induction Type: IV induction Placement Confirmation: positive ETCO2

## 2023-10-09 NOTE — Transfer of Care (Signed)
Immediate Anesthesia Transfer of Care Note  Patient: Amanda Mccoy  Procedure(s) Performed: COLONOSCOPY WITH PROPOFOL POLYPECTOMY  Patient Location: Endoscopy Unit  Anesthesia Type:General  Level of Consciousness: awake  Airway & Oxygen Therapy: Patient Spontanous Breathing  Post-op Assessment: Report given to RN and Post -op Vital signs reviewed and stable  Post vital signs: Reviewed and stable  Last Vitals:  Vitals Value Taken Time  BP    Temp    Pulse    Resp    SpO2      Last Pain:  Vitals:   10/09/23 1042  TempSrc:   PainSc: 0-No pain      Patients Stated Pain Goal: 4 (10/09/23 1008)  Complications: No notable events documented.

## 2023-10-10 NOTE — Anesthesia Postprocedure Evaluation (Signed)
Anesthesia Post Note  Patient: Amanda Mccoy  Procedure(s) Performed: COLONOSCOPY WITH PROPOFOL POLYPECTOMY  Patient location during evaluation: Phase II Anesthesia Type: General Level of consciousness: awake Pain management: pain level controlled Vital Signs Assessment: post-procedure vital signs reviewed and stable Respiratory status: spontaneous breathing and respiratory function stable Cardiovascular status: blood pressure returned to baseline and stable Postop Assessment: no headache and no apparent nausea or vomiting Anesthetic complications: no Comments: Late entry   No notable events documented.   Last Vitals:  Vitals:   10/09/23 1008 10/09/23 1108  BP: (!) 136/94 124/81  Pulse: 78 87  Resp: 16 (!) 22  Temp: 36.9 C 36.4 C  SpO2: 98% 91%    Last Pain:  Vitals:   10/09/23 1108  TempSrc: Oral  PainSc: 0-No pain                 Windell Norfolk

## 2023-10-13 LAB — SURGICAL PATHOLOGY

## 2023-10-16 ENCOUNTER — Encounter: Payer: Self-pay | Admitting: Internal Medicine

## 2023-10-17 ENCOUNTER — Encounter (HOSPITAL_COMMUNITY): Payer: Self-pay | Admitting: Internal Medicine

## 2023-11-05 DIAGNOSIS — H5213 Myopia, bilateral: Secondary | ICD-10-CM | POA: Diagnosis not present

## 2024-06-07 ENCOUNTER — Ambulatory Visit: Admitting: Physician Assistant

## 2024-06-07 ENCOUNTER — Encounter: Payer: Self-pay | Admitting: Physician Assistant

## 2024-06-07 ENCOUNTER — Ambulatory Visit (INDEPENDENT_AMBULATORY_CARE_PROVIDER_SITE_OTHER): Admitting: Physician Assistant

## 2024-06-07 VITALS — BP 121/76 | HR 74 | Temp 98.4°F | Ht 65.5 in | Wt 191.4 lb

## 2024-06-07 DIAGNOSIS — K149 Disease of tongue, unspecified: Secondary | ICD-10-CM | POA: Diagnosis not present

## 2024-06-07 DIAGNOSIS — J301 Allergic rhinitis due to pollen: Secondary | ICD-10-CM

## 2024-06-07 DIAGNOSIS — Z3041 Encounter for surveillance of contraceptive pills: Secondary | ICD-10-CM | POA: Diagnosis not present

## 2024-06-07 DIAGNOSIS — Z1231 Encounter for screening mammogram for malignant neoplasm of breast: Secondary | ICD-10-CM

## 2024-06-07 DIAGNOSIS — Z7689 Persons encountering health services in other specified circumstances: Secondary | ICD-10-CM

## 2024-06-07 DIAGNOSIS — Z0001 Encounter for general adult medical examination with abnormal findings: Secondary | ICD-10-CM

## 2024-06-07 DIAGNOSIS — Z0000 Encounter for general adult medical examination without abnormal findings: Secondary | ICD-10-CM

## 2024-06-07 MED ORDER — NYSTATIN 100000 UNIT/ML MT SUSP: 5.0000 mL | Freq: Four times a day (QID) | OROMUCOSAL | 1 refills | Status: AC

## 2024-06-07 MED ORDER — ISIBLOOM 0.15-30 MG-MCG PO TABS: 1.0000 | ORAL_TABLET | Freq: Every day | ORAL | 4 refills | Status: AC

## 2024-06-07 NOTE — Progress Notes (Signed)
 Complete physical exam  Patient: Amanda Mccoy   DOB: [birth year]   43 y.o. Female  MRN: [id number]  Subjective:    No chief complaint on file.   Amanda Mccoy is a 43 y.o. female who presents today for a complete physical exam. She reports consuming a general diet. The patient has a physically strenuous job, but has no regular exercise apart from work.  She generally feels well. She reports sleeping well. She does have additional problems to discuss today.   Patient requesting refill of birth control pills today. She reports concern for thrush due to whiteness on tongue. She states she has been paying extra attention to her tongue when brushing her teeth. Relates white plaque is easily scraped off. Denies irritation or sore throat. Additionally, patient requests referral to allergy clinic as she has been struggling with allergies more than usual. She relates distant history of allergies as a child with previous need for allergy injections. Now patient reports persist allergy symptoms outside of typical seasonal allergy times. Denies shortness of breath, chest pain, or difficulty breathing.   Most recent fall risk assessment:    06/07/2024    8:23 AM  Fall Risk   Falls in the past year? 1  Number falls in past yr: 0  Injury with Fall? 0     Most recent depression screenings:    06/07/2024    8:23 AM 09/18/2023   11:36 AM  PHQ 2/9 Scores  PHQ - 2 Score 0 0  PHQ- 9 Score 2 0    Vision:Within last year and Dental: No current dental problems and Receives regular dental care  Patient Care Team: Raymondo Garcialopez, Charmaine RIGGERS as PCP - General (Physician Assistant)   Outpatient Medications Prior to Visit  Medication Sig   [DISCONTINUED] desogestrel -ethinyl estradiol  (ISIBLOOM ) 0.15-30 MG-MCG tablet Take 1 tablet by mouth daily.   No facility-administered medications prior to visit.    Review of Systems  Constitutional:  Negative for chills, fever and malaise/fatigue.  Eyes:  Negative  for blurred vision and double vision.  Respiratory:  Positive for cough. Negative for shortness of breath.   Cardiovascular:  Negative for chest pain and palpitations.  Musculoskeletal:  Negative for joint pain and myalgias.  Neurological:  Negative for dizziness and headaches.  Psychiatric/Behavioral:  Negative for depression. The patient is not nervous/anxious.           Objective:     BP 121/76   Pulse 74   Temp 98.4 F (36.9 C)   Ht 5' 5.5 (1.664 m)   Wt 191 lb 6.4 oz (86.8 kg)   SpO2 100%   BMI 31.37 kg/m   Physical Exam Constitutional:      Appearance: Normal appearance.  HENT:     Head: Normocephalic and atraumatic.     Right Ear: Tympanic membrane normal.     Left Ear: Tympanic membrane normal.     Nose: Nose normal.     Mouth/Throat:     Mouth: Mucous membranes are moist.     Pharynx: Oropharynx is clear. No posterior oropharyngeal erythema.  Eyes:     Extraocular Movements: Extraocular movements intact.     Conjunctiva/sclera: Conjunctivae normal.  Neck:     Thyroid : No thyroid  mass, thyromegaly or thyroid  tenderness.  Cardiovascular:     Rate and Rhythm: Normal rate and regular rhythm.     Heart sounds: Normal heart sounds. No murmur heard. Pulmonary:     Effort: Pulmonary effort is normal.  Breath sounds: Normal breath sounds. No wheezing or rales.  Abdominal:     General: Abdomen is flat. Bowel sounds are normal.     Palpations: Abdomen is soft.     Tenderness: There is no abdominal tenderness.  Musculoskeletal:     Cervical back: Normal range of motion and neck supple.  Lymphadenopathy:     Cervical: No cervical adenopathy.  Skin:    General: Skin is warm and dry.  Neurological:     General: No focal deficit present.     Mental Status: She is alert and oriented to person, place, and time.  Psychiatric:        Mood and Affect: Mood normal.        Behavior: Behavior normal.      No results found for any visits on 06/07/24. Last  CBC Lab Results  Component Value Date   WBC 11.4 (H) 09/18/2023   HGB 13.5 09/18/2023   HCT 40.8 09/18/2023   MCV 95 09/18/2023   MCH 31.5 09/18/2023   RDW 12.7 09/18/2023   PLT 288 09/18/2023   Last metabolic panel Lab Results  Component Value Date   GLUCOSE 80 09/18/2023   NA 140 09/18/2023   K 4.8 09/18/2023   CL 101 09/18/2023   CO2 22 09/18/2023   BUN 12 09/18/2023   CREATININE 0.83 09/18/2023   EGFR 90 09/18/2023   CALCIUM 9.3 09/18/2023   PROT 6.9 09/18/2023   ALBUMIN 4.2 09/18/2023   LABGLOB 2.7 09/18/2023   AGRATIO 1.5 07/04/2022   BILITOT 0.3 09/18/2023   ALKPHOS 47 09/18/2023   AST 12 09/18/2023   ALT 8 09/18/2023   ANIONGAP 8 02/21/2020   Last lipids Lab Results  Component Value Date   CHOL 194 09/18/2023   HDL 86 09/18/2023   LDLCALC 89 09/18/2023   TRIG 110 09/18/2023   CHOLHDL 2.3 09/18/2023   Last hemoglobin A1c Lab Results  Component Value Date   HGBA1C 5.7 (H) 09/18/2023      Assessment & Plan:    Routine Health Maintenance and Physical Exam  Health Maintenance  Topic Date Due   Hepatitis B Vaccine (1 of 3 - 19+ 3-dose series) Never done   DTaP/Tdap/Td vaccine (2 - Td or Tdap) 04/28/2023   COVID-19 Vaccine (1) 06/23/2024*   Flu Shot  02/01/2025*   HPV Vaccine (1 - Risk 3-dose SCDM series) 06/07/2025*   Mammogram  10/08/2024   Pap with HPV screening  07/05/2027   Hepatitis C Screening  Completed   HIV Screening  Completed   Meningitis B Vaccine  Aged Out  *Topic was postponed. The date shown is not the original due date.    Discussed health benefits of physical activity, and encouraged her to engage in regular exercise appropriate for her age and condition.  Problem List Items Addressed This Visit     Non-seasonal allergic rhinitis due to pollen   Relevant Orders   Ambulatory referral to Allergy   Other Visit Diagnoses       Encounter to establish care    -  Primary     Annual visit for general adult medical examination  without abnormal findings         Tongue irritation       Relevant Medications   magic mouthwash (nystatin , lidocaine , diphenhydrAMINE , alum & mag hydroxide) suspension     Encounter for screening mammogram for malignant neoplasm of breast       Relevant Orders   MM 3D SCREENING MAMMOGRAM  BILATERAL BREAST     Surveillance for birth control, oral contraceptives       Relevant Medications   desogestrel -ethinyl estradiol  (ISIBLOOM ) 0.15-30 MG-MCG tablet      Return in about 1 year (around 06/07/2025).  Safety measures discussed. Immunizations reviewed: patient defers immunizations at this time, recommended updating Tdap and annual influenza vaccination.  Diet and exercise/ lifestyle modifications discussed.  Recommend 150 minutes per week of exercise such as walking. Recommend lots of fresh produce to include fruits, vegetables, beans, healthy fats such as avocado, nuts, seeds, and 3-6 ounces of protein at each meal.  Avoid fried foods and fast food. Limit alcohol consumption: no more than one drink per day for women and 2 drinks per day for men.  Stress management discussed. Routine vision and dental screening discussed: recommend dentist every 6 months, gets vision checked every 1-2 years.  Health maintenance: \Mammogram due in December, Pap due in 2026.  Questions answered.        Charmaine Sahith Nurse, PA-C

## 2024-09-06 ENCOUNTER — Ambulatory Visit (INDEPENDENT_AMBULATORY_CARE_PROVIDER_SITE_OTHER): Admitting: Internal Medicine

## 2024-09-06 ENCOUNTER — Encounter: Payer: Self-pay | Admitting: Internal Medicine

## 2024-09-06 VITALS — BP 116/72 | HR 74 | Temp 98.2°F | Ht 65.5 in | Wt 185.6 lb

## 2024-09-06 DIAGNOSIS — J3089 Other allergic rhinitis: Secondary | ICD-10-CM | POA: Diagnosis not present

## 2024-09-06 DIAGNOSIS — L500 Allergic urticaria: Secondary | ICD-10-CM

## 2024-09-06 MED ORDER — CETIRIZINE HCL 10 MG PO TABS: 10.0000 mg | ORAL_TABLET | Freq: Two times a day (BID) | ORAL | 5 refills | Status: AC | PRN

## 2024-09-06 NOTE — Patient Instructions (Addendum)
 Rash: - Possibly hives/urticaria.   - Take pictures of rashes for me. - At this time etiology of hives and swelling is unknown. Hives can be caused by a variety of different triggers including illness/infection, pressure, vibrations, extremes of temperature to name a few however majority of the time there is no identifiable trigger.  - Use Zyrtec  (Cetirizine) 10 mg twice daily as needed for rashes/itching.    Other Allergic Rhinitis: - Use nasal saline rinses before nose sprays such as with Neilmed Sinus Rinse.  Use distilled water .  - Use Zyrtec  (Cetirizine) 10 mg daily as needed.    Hold all anti-histamines (Xyzal, Allegra, Zyrtec, Claritin, Benadryl , Pepcid) 3 days prior to next visit.  Follow up: 11/10 at 830 for skin testing 1-68

## 2024-09-06 NOTE — Progress Notes (Signed)
 NEW PATIENT  Date of Service/Encounter:  09/06/24  Consult requested by: Grooms, [place], PA-C   Subjective:   Amanda Mccoy (DOB: 07/13/1981) is a 43 y.o. female who presents to the clinic on 09/06/2024 with a chief complaint of Establish Care (Pt is here today to establish care. She is having increased allergy symptoms, she has a past history from childhood to being allergic to environmental allergens and she was on allergy injections. Allergy symptoms over the past 3 months have been uncontrolled and she would like better symptom management. ) .    History obtained from: chart review and patient.   Pruritus: Started few months ago. Itching all over but the location moves around.  Sometimes notices a rash.   Thinks they are hives.   Lasts about 1-2 days. Happens once every 2 weeks.  No scarring/pain.  No swelling. No new medications, no new products.   No illness.   Rhinitis:  Started since childhood.  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   Previous allergy testing: yes in childhood.  History of sinus surgery: no Nonallergic triggers: none     Reviewed:  06/07/2024: seen by Groom PA for rhinitis, referred to Allergy. 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   09/18/2023: seen by gyn for annual visit, no significant PMH/PSH.  Referred to GI due to family hx of CC.  2021: AEC 100  Past Medical History: Past Medical History:  Diagnosis Date   Eczema     Past Surgical History: Past Surgical History:  Procedure Laterality Date   COLONOSCOPY WITH PROPOFOL  N/A 10/09/2023   Procedure: COLONOSCOPY WITH PROPOFOL ;  Surgeon: Shaaron Lamar HERO, MD;  Location: [facility];  Service: Endoscopy;  Laterality: N/A;  11:15 am, asa 2   NO PAST SURGERIES     POLYPECTOMY  10/09/2023   Procedure:  POLYPECTOMY;  Surgeon: Shaaron Lamar HERO, MD;  Location: [facility];  Service: Endoscopy;;    Family History: Family History  Problem Relation Age of Onset   Allergic rhinitis Mother    Hyperlipidemia Mother    Hyperlipidemia Father    Colon cancer Brother    Emphysema Maternal Grandmother    Cancer Maternal Grandfather        colon   Cancer Paternal Grandfather        prostate   Hypertension Other    Stroke Other    Cancer Other        prostate, colon, cervical   Heart attack Other    Coronary artery disease Other     Social History:  Flooring in bedroom: tile Pets: none Tobacco use/exposure: none Job: casino  Medication List:  Allergies as of 09/06/2024   No Known Allergies      Medication List        Accurate as of September 06, 2024  9:16 AM. If you have any questions, ask your nurse or doctor.          Isibloom  0.15-30 MG-MCG tablet Generic drug: desogestrel -ethinyl estradiol  Take 1 tablet by mouth daily.   magic mouthwash (nystatin , lidocaine , diphenhydrAMINE , alum & mag hydroxide) suspension Swish and spit 5 mLs 4 (four) times daily.         REVIEW OF SYSTEMS: Pertinent positives and negatives discussed in HPI.   Objective:   Physical Exam: BP 116/72 (  BP Location: Left Arm, Patient Position: Sitting, Cuff Size: Normal)   Pulse 74   Temp 98.2 F (36.8 C) (Temporal)   Ht 5' 5.5 (1.664 m)   Wt 185 lb 9.6 oz (84.2 kg)   SpO2 99%   BMI 30.42 kg/m  Body mass index is 30.42 kg/m. GEN: alert, well developed HEENT: clear conjunctiva, nose with + mild inferior turbinate hypertrophy, pink nasal mucosa, slight clear rhinorrhea, + cobblestoning HEART: regular rate and rhythm, no murmur LUNGS: clear to auscultation bilaterally, no coughing, unlabored respiration ABDOMEN: soft, non distended  SKIN: no rashes or lesions  Assessment:   1. Other allergic rhinitis   2. Allergic urticaria     Plan/Recommendations:  Rash: - Possibly  hives/urticaria.   - Take pictures of rashes for me. - At this time etiology of hives and swelling is unknown. Hives can be caused by a variety of different triggers including illness/infection, pressure, vibrations, extremes of temperature to name a few however majority of the time there is no identifiable trigger.  - Use Zyrtec  (Cetirizine) 10 mg twice daily as needed for rashes/itching.    Other Allergic Rhinitis: - Due to turbinate hypertrophy, seasonal symptoms, recurrent hives and unresponsive to over the counter meds, will perform skin testing to identify aeroallergen triggers.   - Use nasal saline rinses before nose sprays such as with Neilmed Sinus Rinse.  Use distilled water .  - Use Zyrtec  (Cetirizine) 10 mg daily as needed.    Hold all anti-histamines (Xyzal, Allegra, Zyrtec, Claritin, Benadryl , Pepcid) 3 days prior to next visit.  Follow up: 11/10 at 830 for skin testing 1-68, IDs okay     No follow-ups on file.  Arleta Blanch, MD [facility] 

## 2024-09-13 ENCOUNTER — Ambulatory Visit (INDEPENDENT_AMBULATORY_CARE_PROVIDER_SITE_OTHER): Admitting: Internal Medicine

## 2024-09-13 ENCOUNTER — Encounter: Payer: Self-pay | Admitting: Internal Medicine

## 2024-09-13 DIAGNOSIS — L500 Allergic urticaria: Secondary | ICD-10-CM | POA: Diagnosis not present

## 2024-09-13 DIAGNOSIS — J301 Allergic rhinitis due to pollen: Secondary | ICD-10-CM

## 2024-09-13 DIAGNOSIS — J3089 Other allergic rhinitis: Secondary | ICD-10-CM

## 2024-09-13 MED ORDER — AZELASTINE HCL 0.1 % NA SOLN: 2.0000 | Freq: Two times a day (BID) | NASAL | 5 refills | Status: AC | PRN

## 2024-09-13 NOTE — Patient Instructions (Addendum)
 Rash: - Possibly hives/urticaria.   - Take pictures of rashes for me. - At this time etiology of hives and swelling is unknown. Hives can be caused by a variety of different triggers including illness/infection, pressure, vibrations, extremes of temperature to name a few however majority of the time there is no identifiable trigger.  - Use Zyrtec  (Cetirizine) 10 mg twice daily as needed for rashes/itching.      Allergic Rhinitis: - Positive skin test 09/2024: trees, grasses, weeds, dust mites, cockroach - Use nasal saline rinses before nose sprays such as with Neilmed Sinus Rinse.  Use distilled water .   - Use Azelastine 2 sprays each nostril twice daily as needed for runny nose, drainage, sneezing, congestion. Aim upward and outward. - Use Zyrtec 10 mg daily.  - Consider allergy shots as long term control of your symptoms by teaching your immune system to be more tolerant of your allergy triggers   ALLERGEN AVOIDANCE MEASURES   Dust Mites Use central air conditioning and heat; and change the filter monthly.  Pleated filters work better than mesh filters.  Electrostatic filters may also be used; wash the filter monthly.  Window air conditioners may be used, but do not clean the air as well as a central air conditioner.  Change or wash the filter monthly. Keep windows closed.  Do not use attic fans.   Encase the mattress, box springs and pillows with zippered, dust proof covers. Wash the bed linens in hot water  weekly.   Remove carpet, especially from the bedroom. Remove stuffed animals, throw pillows, dust ruffles, heavy drapes and other items that collect dust from the bedroom. Do not use a humidifier.   Use wood, vinyl or leather furniture instead of cloth furniture in the bedroom. Keep the indoor humidity at 30 - 40%.   Cockroach Limit spread of food around the house; especially keep food out of bedrooms. Keep food and garbage in closed containers with a tight lid.  Never leave  food out in the kitchen.  Do not leave out pet food or dirty food bowls. Mop the kitchen floor and wash countertops at least once a week. Repair leaky pipes and faucets so there is no standing water  to attract roaches. Plug up cracks in the house through which cockroaches can enter. Use bait stations and approved pesticides to reduce cockroach infestation. Pollen Avoidance Pollen levels are highest during the mid-day and afternoon.  Consider this when planning outdoor activities. Avoid being outside when the grass is being mowed, or wear a mask if the pollen-allergic person must be the one to mow the grass. Keep the windows closed to keep pollen outside of the home. Use an air conditioner to filter the air. Take a shower, wash hair, and change clothing after working or playing outdoors during pollen season.

## 2024-09-13 NOTE — Progress Notes (Signed)
 FOLLOW UP Date of Service/Encounter:  09/13/24   Subjective:  Amanda Mccoy (DOB: 14-Feb-1981) is a 43 y.o. female who [facility] for follow up for skin testing.   History obtained from: chart review and patient.  Anti histamines held.   Past Medical History: Past Medical History:  Diagnosis Date   Eczema     Objective:  There were no vitals taken for this visit. There is no height or weight on file to calculate BMI. Physical Exam: GEN: alert, well developed HEENT: clear conjunctiva, MMM LUNGS: unlabored respiration  Skin Testing:  Skin prick testing was placed, which includes aeroallergens/foods, histamine control, and saline control.  Verbal consent was obtained prior to placing test.  Patient tolerated procedure well.  Allergy testing results were read and interpreted by myself, documented by clinical staff. Adequate positive and negative control.  Positive results to:  Results discussed with patient/family.  Airborne Adult Perc - 09/13/24 0800     Time Antigen Placed 9161    Allergen Manufacturer Jestine    Location Back    Number of Test 55    1. Control-Buffer 50% Glycerol Negative    2. Control-Histamine 3+    3. Bahia Negative    4. Bermuda 2+    5. Johnson Negative    6. [place]  Blue 3+    7. Meadow Fescue 3+    8. Perennial Rye Negative    9. Timothy 3+    10. Ragweed Mix Negative    11. Cocklebur Negative    12. Plantain,  English 2+    13. Baccharis Negative    14. Dog Fennel Negative    15. Russian Thistle Negative    16. Lamb's Quarters Negative    17. Sheep Sorrell Negative    18. Rough Pigweed Negative    19. Marsh Elder, Rough Negative    20. Mugwort, Common Negative    21. Box, Elder Negative    22. Cedar, red Negative    23. Sweet Gum Negative    24. Pecan Pollen 3+    25. Pine Mix 2+    26. Walnut, Black Pollen Negative    27. Red Mulberry Negative    28. Ash Mix 2+    29. Birch Mix 3+     30. Beech American Negative    31. Cottonwood, Eastern Negative    32. Hickory, White 3+    33. Maple Mix 2+    34. Oak, Eastern Mix Negative    35. Sycamore Eastern Negative    36. Alternaria Alternata Negative    37. Cladosporium Herbarum Negative    38. Aspergillus Mix Negative    39. Penicillium Mix Negative    40. Bipolaris Sorokiniana (Helminthosporium) Negative    41. Drechslera Spicifera (Curvularia) Negative    42. Mucor Plumbeus Negative    43. Fusarium Moniliforme Negative    44. Aureobasidium Pullulans (pullulara) Negative    45. Rhizopus Oryzae Negative    46. Botrytis Cinera Negative    47. Epicoccum Nigrum Negative    48. Phoma Betae Negative    49. Dust Mite Mix 3+    50. Cat Hair 10,000 BAU/ml Negative    51.  Dog Epithelia Negative    52. Mixed Feathers Negative    53. Horse Epithelia Negative    54. Cockroach, German Negative    55. Tobacco Leaf Negative          13 Food Perc - 09/13/24 0800  Test Information   Time Antigen Placed [phone]    Allergen Manufacturer Jestine    Location Back    Number of allergen test 13      Food   1. Peanut Negative    2. Soybean Negative    3. Wheat Negative    4. Sesame Negative    5. Milk, Cow Negative    6. Casein Negative    7. Egg White, Chicken Negative    8. Shellfish Mix Negative    9. Fish Mix Negative    10. Cashew Negative    11. Walnut Food Negative    12. Almond Negative    13. Hazelnut Negative          Intradermal - 09/13/24 0948     Time Antigen Placed 9051    Allergen Manufacturer Jestine    Location Arm    Number of Test 12    Control Negative    Bahia 2+    Bermuda Negative    Johnson 2+    Ragweed Mix Negative    Weed Mix Negative    Mold 1 Negative    Mold 2 Negative    Mold 3 Negative    Mold 4 Negative    Cat Negative    Dog Negative    Cockroach 2+           Assessment:   1. Seasonal allergic rhinitis due to pollen   2. Allergic urticaria   3. Allergic  rhinitis due to dust mite   4. Allergic rhinitis due to insect     Plan/Recommendations:   Rash: - Possibly hives/urticaria.   - Take pictures of rashes for me. - At this time etiology of hives and swelling is unknown. Hives can be caused by a variety of different triggers including illness/infection, pressure, vibrations, extremes of temperature to name a few however majority of the time there is no identifiable trigger.  - Use Zyrtec  (Cetirizine) 10 mg twice daily as needed for rashes/itching.      Allergic Rhinitis: - Due to turbinate hypertrophy, seasonal symptoms, recurrent hives and unresponsive to over the counter meds, will perform skin testing to identify aeroallergen triggers.   - Positive skin test 09/2024: trees, grasses, weeds, dust mites, cockroach - Avoidance measures discussed. - Use nasal saline rinses before nose sprays such as with Neilmed Sinus Rinse.  Use distilled water .   - Use Azelastine 2 sprays each nostril twice daily as needed for runny nose, drainage, sneezing, congestion. Aim upward and outward. - Use Zyrtec 10 mg daily.  - Consider allergy shots as long term control of your symptoms by teaching your immune system to be more tolerant of your allergy triggers     Return in about 4 months (around 01/11/2025).  Arleta Blanch, MD [facility] 

## 2024-11-29 ENCOUNTER — Ambulatory Visit (HOSPITAL_COMMUNITY)

## 2024-12-08 ENCOUNTER — Ambulatory Visit (HOSPITAL_COMMUNITY)

## 2024-12-09 ENCOUNTER — Inpatient Hospital Stay (HOSPITAL_COMMUNITY): Admission: RE | Admit: 2024-12-09 | Discharge: 2024-12-09 | Attending: Physician Assistant

## 2025-01-17 ENCOUNTER — Ambulatory Visit: Admitting: Internal Medicine

## 2025-01-19 ENCOUNTER — Ambulatory Visit: Admitting: Family Medicine
